# Patient Record
Sex: Female | Born: 1975
Health system: Southern US, Community
[De-identification: ages and names within clinical notes are randomized; demographics above are authoritative.]

## PROBLEM LIST (undated history)

## (undated) ENCOUNTER — Emergency Department: Discharge status: Absent without leave | Payer: Self-pay

## (undated) DIAGNOSIS — J45909 Unspecified asthma, uncomplicated: Secondary | ICD-10-CM

## (undated) DIAGNOSIS — I10 Essential (primary) hypertension: Principal | ICD-10-CM

## (undated) DIAGNOSIS — G4700 Insomnia, unspecified: Secondary | ICD-10-CM

## (undated) DIAGNOSIS — E669 Obesity, unspecified: Secondary | ICD-10-CM

## (undated) DIAGNOSIS — F419 Anxiety disorder, unspecified: Secondary | ICD-10-CM

## (undated) HISTORY — PX: CHOLECYSTECTOMY: SHX55

## (undated) HISTORY — DX: Obesity, unspecified: E66.9

## (undated) HISTORY — DX: Essential (primary) hypertension: I10

## (undated) HISTORY — DX: Unspecified asthma, uncomplicated: J45.909

---

## 1999-10-12 HISTORY — PX: BREAST REDUCTION SURGERY: SHX8

## 1999-10-12 HISTORY — PX: OTHER SURGICAL HISTORY: SHX169

## 2008-10-11 HISTORY — PX: TUBAL LIGATION: SHX77

## 2010-10-11 HISTORY — PX: OTHER SURGICAL HISTORY: SHX169

## 2014-01-02 ENCOUNTER — Encounter: Payer: Self-pay | Admitting: *Deleted

## 2014-01-02 ENCOUNTER — Encounter: Payer: Self-pay | Admitting: Family Medicine

## 2014-01-02 ENCOUNTER — Ambulatory Visit (INDEPENDENT_AMBULATORY_CARE_PROVIDER_SITE_OTHER): Payer: 59 | Admitting: Family Medicine

## 2014-01-02 VITALS — BP 139/93 | HR 82 | Ht 62.0 in | Wt 187.0 lb

## 2014-01-02 DIAGNOSIS — I10 Essential (primary) hypertension: Secondary | ICD-10-CM

## 2014-01-02 DIAGNOSIS — G4700 Insomnia, unspecified: Secondary | ICD-10-CM

## 2014-01-02 DIAGNOSIS — J45909 Unspecified asthma, uncomplicated: Secondary | ICD-10-CM | POA: Insufficient documentation

## 2014-01-02 DIAGNOSIS — E669 Obesity, unspecified: Secondary | ICD-10-CM | POA: Insufficient documentation

## 2014-01-02 DIAGNOSIS — E785 Hyperlipidemia, unspecified: Secondary | ICD-10-CM

## 2014-01-02 HISTORY — DX: Essential (primary) hypertension: I10

## 2014-01-02 HISTORY — DX: Unspecified asthma, uncomplicated: J45.909

## 2014-01-02 HISTORY — DX: Obesity, unspecified: E66.9

## 2014-01-02 MED ORDER — MONTELUKAST SODIUM 10 MG PO TABS: 10.0000 mg | ORAL_TABLET | Freq: Every day | ORAL | Status: DC

## 2014-01-02 MED ORDER — ZOLPIDEM TARTRATE 5 MG PO TABS: 5.0000 mg | ORAL_TABLET | Freq: Every evening | ORAL | Status: DC | PRN

## 2014-01-02 NOTE — Progress Notes (Signed)
CC: [name] [name] is a 38 y.o. female is here for Establish Care   Subjective: HPI:  Very pleasant 38 year old here to establish care  Patient complains of cough has been present for the past month that has been getting worse a weekly basis. Described as dry worse first thing in the morning and in the evenings. Improves with using albuterol that her son has. She has remote diagnosis of asthma in her young adult life. Symptoms are mild-to-moderate in severity. She admits to nocturnal coughing occasionally waking her up.    Reports a history essential hypertension she has been prescribed atenolol in the past however takes this only sporadically. No outside blood pressures to report. Denies any side effects from atenolol. Has not taken this week.  Reports a history of hyperlipidemia she's unsure when her last cholesterol panel was taken she has never been on cholesterol medication.  Her largest complaint today is insomnia that has been present for matter of months described as difficulty staying asleep and getting asleep. Nothing particularly makes it better or worse. She's tried melatonin and NyQuil both have lost their effectiveness. She denies anxiety, pain, nor mental disturbance. She goes to sleep at the same time every night same location without napping during the daytime.  She wants to know if she should get the LAP-BAND, a former provider told her that she does not weigh enough for it.   Review of Systems - General ROS: negative for - chills, fever, night sweats, weight gain or weight loss Ophthalmic ROS: negative for - decreased vision Psychological ROS: negative for - anxiety or depression ENT ROS: negative for - hearing change, nasal congestion, tinnitus or allergies Hematological and Lymphatic ROS: negative for - bleeding problems, bruising or swollen lymph nodes Breast ROS: negative Respiratory ROS: no shortness of breath, or wheezing Cardiovascular ROS: no chest pain or  dyspnea on exertion Gastrointestinal ROS: no abdominal pain, change in bowel habits, or black or bloody stools Genito-Urinary ROS: negative for - genital discharge, genital ulcers, incontinence or abnormal bleeding from genitals Musculoskeletal ROS: negative for - joint pain or muscle pain Neurological ROS: negative for - headaches or memory loss Dermatological ROS: negative for lumps, mole changes, rash and skin lesion changes  Past Medical History  Diagnosis Date  . Obesity 01/02/2014  . Asthma, chronic 01/02/2014  . Essential hypertension, benign 01/02/2014    History reviewed. No pertinent past surgical history. No family history on file.  History   Social History  . Marital Status: Married    Spouse Name: N/A    Number of Children: N/A  . Years of Education: N/A   Occupational History  . Not on file.   Social History Main Topics  . Smoking status: Never Smoker   . Smokeless tobacco: Not on file  . Alcohol Use: No  . Drug Use: Not on file  . Sexual Activity: Yes    Partners: Male   Other Topics Concern  . Not on file   Social History Narrative  . No narrative on file     Objective: BP 139/93  Pulse 82  Ht 5\' 2"  (1.575 m)  Wt 187 lb (84.823 kg)  BMI 34.19 kg/m2  General: Alert and Oriented, No Acute Distress HEENT: Pupils equal, round, reactive to light. Conjunctivae clear.  External ears unremarkable, canals clear with intact TMs with appropriate landmarks.  Middle ear appears open without effusion. Pink inferior turbinates.  Moist mucous membranes, pharynx without inflammation nor lesions however moderate cobblestoning.  Neck supple  without palpable lymphadenopathy nor abnormal masses. Lungs: Clear to auscultation bilaterally, no wheezing/ronchi/rales.  Comfortable work of breathing. Good air movement. Cardiac: Regular rate and rhythm. Normal S1/S2.  No murmurs, rubs, nor gallops.   Abdomen: Obese and soft Extremities: No peripheral edema.  Strong peripheral  pulses.  Mental Status: No depression, anxiety, nor agitation. Skin: Warm and dry.  Assessment & Plan: Chey was seen today for establish care.  Diagnoses and associated orders for this visit:  Essential hypertension, benign - BASIC METABOLIC PANEL WITH GFR  Hyperlipidemia - Lipid panel  Insomnia - zolpidem (AMBIEN) 5 MG tablet; Take 1 tablet (5 mg total) by mouth at bedtime as needed for sleep.  Asthma, chronic - montelukast (SINGULAIR) 10 MG tablet; Take 1 tablet (10 mg total) by mouth at bedtime.  Obesity    Essential hypertension: Uncontrolled we discussed diet and exercise interventions especially sodium restriction. Hold off on taking atenolol and focus only on diet and exercise interventions along with better sleep habits. If still uncontrolled in one month Will restart daily atenolol. Hyperlipidemia: Due for lipid panel Insomnia: Uncontrolled chronic condition start low-dose Ambien Asthma: Uncontrolled start Singulair  obesity: We will refer her to Dr. Romeo [facility] bariatric clinic for further questions regarding weight management  Return in about 4 weeks (around 01/30/2014).

## 2014-01-03 ENCOUNTER — Telehealth: Payer: Self-pay | Admitting: Family Medicine

## 2014-01-03 DIAGNOSIS — E669 Obesity, unspecified: Secondary | ICD-10-CM

## 2014-01-03 NOTE — Telephone Encounter (Signed)
Dr. Valetta Close Referral

## 2014-01-08 ENCOUNTER — Telehealth: Payer: Self-pay | Admitting: *Deleted

## 2014-01-08 DIAGNOSIS — J45909 Unspecified asthma, uncomplicated: Secondary | ICD-10-CM

## 2014-01-08 NOTE — Telephone Encounter (Signed)
Pt is calling requesting something for her cough.she was seen on the 25th and she states she still has the cough

## 2014-01-09 MED ORDER — PREDNISONE 20 MG PO TABS: ORAL_TABLET | ORAL | Status: DC

## 2014-01-09 NOTE — Telephone Encounter (Signed)
Prednisone taper sent to [facility].

## 2014-01-09 NOTE — Telephone Encounter (Signed)
Pt notified via vm.(home #, cell not in service)

## 2014-01-14 ENCOUNTER — Telehealth: Payer: Self-pay | Admitting: *Deleted

## 2014-01-14 DIAGNOSIS — E669 Obesity, unspecified: Secondary | ICD-10-CM

## 2014-01-14 NOTE — Telephone Encounter (Signed)
Referral has been placed. 

## 2014-01-14 NOTE — Telephone Encounter (Signed)
Pt wants to see bariatric physician within [place]. She doesn't want to go to a [facility]

## 2014-01-14 NOTE — Telephone Encounter (Signed)
Called and it said she was unavail

## 2014-01-18 ENCOUNTER — Ambulatory Visit (INDEPENDENT_AMBULATORY_CARE_PROVIDER_SITE_OTHER): Payer: 59 | Admitting: Family Medicine

## 2014-01-18 ENCOUNTER — Encounter: Payer: Self-pay | Admitting: Family Medicine

## 2014-01-18 VITALS — BP 155/102 | HR 84 | Wt 178.0 lb

## 2014-01-18 DIAGNOSIS — I10 Essential (primary) hypertension: Secondary | ICD-10-CM

## 2014-01-18 DIAGNOSIS — R002 Palpitations: Secondary | ICD-10-CM

## 2014-01-18 LAB — BASIC METABOLIC PANEL WITH GFR
BUN: 15 mg/dL (ref 6–23)
CO2: 29 mEq/L (ref 19–32)
Calcium: 10.4 mg/dL (ref 8.4–10.5)
Chloride: 97 mEq/L (ref 96–112)
Creat: 0.99 mg/dL (ref 0.50–1.10)
GFR, EST NON AFRICAN AMERICAN: 73 mL/min
GFR, Est African American: 84 mL/min
Glucose, Bld: 98 mg/dL (ref 70–99)
POTASSIUM: 4.2 meq/L (ref 3.5–5.3)
Sodium: 139 mEq/L (ref 135–145)

## 2014-01-18 LAB — CBC
HEMATOCRIT: 42.8 % (ref 36.0–46.0)
Hemoglobin: 13.9 g/dL (ref 12.0–15.0)
MCH: 27.1 pg (ref 26.0–34.0)
MCHC: 32.5 g/dL (ref 30.0–36.0)
MCV: 83.4 fL (ref 78.0–100.0)
Platelets: 320 10*3/uL (ref 150–400)
RBC: 5.13 MIL/uL — ABNORMAL HIGH (ref 3.87–5.11)
RDW: 13.8 % (ref 11.5–15.5)
WBC: 16.3 10*3/uL — ABNORMAL HIGH (ref 4.0–10.5)

## 2014-01-18 LAB — LIPID PANEL
CHOLESTEROL: 151 mg/dL (ref 0–200)
HDL: 51 mg/dL (ref >39)
LDL CALC: 86 mg/dL (ref 0–99)
Total CHOL/HDL Ratio: 3 Ratio
Triglycerides: 71 mg/dL (ref <150)
VLDL: 14 mg/dL (ref 0–40)

## 2014-01-18 LAB — COMPLETE METABOLIC PANEL WITH GFR
ALBUMIN: 4.3 g/dL (ref 3.5–5.2)
ALT: 45 U/L — AB (ref 0–35)
AST: 17 U/L (ref 0–37)
Alkaline Phosphatase: 51 U/L (ref 39–117)
BUN: 15 mg/dL (ref 6–23)
CHLORIDE: 97 meq/L (ref 96–112)
CO2: 29 mEq/L (ref 19–32)
Calcium: 10.5 mg/dL (ref 8.4–10.5)
Creat: 0.98 mg/dL (ref 0.50–1.10)
GFR, Est African American: 85 mL/min
GFR, Est Non African American: 73 mL/min
Glucose, Bld: 100 mg/dL — ABNORMAL HIGH (ref 70–99)
Potassium: 3.9 mEq/L (ref 3.5–5.3)
SODIUM: 138 meq/L (ref 135–145)
TOTAL PROTEIN: 7.8 g/dL (ref 6.0–8.3)
Total Bilirubin: 0.8 mg/dL (ref 0.2–1.2)

## 2014-01-18 MED ORDER — METOPROLOL SUCCINATE ER 50 MG PO TB24: 50.0000 mg | ORAL_TABLET | Freq: Every day | ORAL | Status: DC

## 2014-01-18 NOTE — Progress Notes (Signed)
CC: [name] [name] is a 38 y.o. female is here for Palpitations   Subjective: HPI:  Reports palpitations for the past week described as a skipped beat that occurs occasionally throughout the day. Nothing particularly makes it better or worse. Inventions have included trying atenolol without any improvement or decline of symptoms. Since that occur anytime of the day they do not interfere with sleep. And they're not exacerbated by exercise or exertion. Symptoms overall are moderate in severity Denies motor or sensory disturbances, exertional chest pain, shortness of breath, chest pain at rest, diaphoresis, limb claudication, nor jaw pain. No outside blood pressures to report.    Review Of Systems Outlined In HPI  Past Medical History  Diagnosis Date  . Obesity 01/02/2014  . Asthma, chronic 01/02/2014  . Essential hypertension, benign 01/02/2014    Past Surgical History  Procedure Laterality Date  . Gallbladder removed  2001  . Breast reduction surgery  2001  . Lipo suction  2012  . Tubal ligation  2010   No family history on file.  History   Social History  . Marital Status: Married    Spouse Name: N/A    Number of Children: N/A  . Years of Education: N/A   Occupational History  . Not on file.   Social History Main Topics  . Smoking status: Never Smoker   . Smokeless tobacco: Not on file  . Alcohol Use: No  . Drug Use: Not on file  . Sexual Activity: Yes    Partners: Male   Other Topics Concern  . Not on file   Social History Narrative  . No narrative on file     Objective: BP 155/102  Pulse 84  Wt 178 lb (80.74 kg)  General: Alert and Oriented, No Acute Distress HEENT: Pupils equal, round, reactive to light. Conjunctivae clear.  Moist mucous membranes pharynx unremarkable Lungs: Clear to auscultation bilaterally, no wheezing/ronchi/rales.  Comfortable work of breathing. Good air movement. Cardiac: Regular rate and rhythm. Normal S1/S2.  No murmurs, rubs, nor  gallops.  No carotid bruit Abdomen: Normal bowel sounds, soft and non tender without palpable masses. Extremities: No peripheral edema.  Strong peripheral pulses.  Mental Status: No depression, anxiety, nor agitation. Skin: Warm and dry.  Assessment & Plan: Cher was seen today for palpitations.  Diagnoses and associated orders for this visit:  Palpitations - TSH - COMPLETE METABOLIC PANEL WITH GFR - CBC - PR ELECTROCARDIOGRAM, COMPLETE  Essential hypertension, benign - metoprolol succinate (TOPROL-XL) 50 MG 24 hr tablet; Take 1 tablet (50 mg total) by mouth daily. Take with or immediately following a meal.    Palpitations: EKG was obtained showing normal sinus rhythm, normal axis, normal intervals, Q waves in lead 3, no ST elevation or depression.  Start beta blocker below rule out hyperthyroidism, electrolyte abnormality or anemia Essential hypertension: Uncontrolled start metoprolol    Return in about 4 weeks (around 02/15/2014).

## 2014-01-19 LAB — TSH: TSH: 1.174 u[IU]/mL (ref 0.350–4.500)

## 2014-01-21 ENCOUNTER — Encounter: Payer: Self-pay | Admitting: *Deleted

## 2014-01-21 ENCOUNTER — Encounter: Payer: Self-pay | Admitting: Family Medicine

## 2014-01-21 DIAGNOSIS — D72829 Elevated white blood cell count, unspecified: Secondary | ICD-10-CM | POA: Insufficient documentation

## 2014-01-28 ENCOUNTER — Ambulatory Visit (INDEPENDENT_AMBULATORY_CARE_PROVIDER_SITE_OTHER): Payer: 59 | Admitting: Family Medicine

## 2014-01-28 ENCOUNTER — Encounter: Payer: Self-pay | Admitting: Family Medicine

## 2014-01-28 VITALS — BP 134/87 | HR 84 | Wt 184.0 lb

## 2014-01-28 DIAGNOSIS — D72829 Elevated white blood cell count, unspecified: Secondary | ICD-10-CM

## 2014-01-28 DIAGNOSIS — I10 Essential (primary) hypertension: Secondary | ICD-10-CM

## 2014-01-28 MED ORDER — METOPROLOL SUCCINATE ER 50 MG PO TB24: 50.0000 mg | ORAL_TABLET | Freq: Every day | ORAL | Status: DC

## 2014-01-28 NOTE — Progress Notes (Signed)
CC: [name] [name] is a 38 y.o. female is here for f/u heart palpitations and blood work   Subjective: HPI:  Followup essential hypertension: Since starting metoprolol she has had resolution of her palpitations. no outside blood pressures to report. Denies chest pain, shortness of breath, orthopnea nor peripheral edema. Denies motor or sensory disturbances nor lightheadedness.  Followup leukocytosis: From what she can recall when she gave her blood sample earlier this month she was in her regular state of health without any symptoms of allergies nor any sense of feeling unwell.  At that time she specifically denies any cough, shortness of breath, wheezing, abdominal pain, skin complaints, nor genitourinary complaints. Day she says that she has mild nasal congestion and a mild nonproductive cough it has slowly been improving over the past 3 days. Denies fevers. Has never been told that her white blood count is elevated. Denies any bleeding abnormalities, swollen lymph nodes, nor trouble fighting infections.   Review Of Systems Outlined In HPI  Past Medical History  Diagnosis Date  . Obesity 01/02/2014  . Asthma, chronic 01/02/2014  . Essential hypertension, benign 01/02/2014    Past Surgical History  Procedure Laterality Date  . Gallbladder removed  2001  . Breast reduction surgery  2001  . Lipo suction  2012  . Tubal ligation  2010   No family history on file.  History   Social History  . Marital Status: Married    Spouse Name: N/A    Number of Children: N/A  . Years of Education: N/A   Occupational History  . Not on file.   Social History Main Topics  . Smoking status: Never Smoker   . Smokeless tobacco: Not on file  . Alcohol Use: No  . Drug Use: Not on file  . Sexual Activity: Yes    Partners: Male   Other Topics Concern  . Not on file   Social History Narrative  . No narrative on file     Objective: BP 134/87  Pulse 84  Wt 184 lb (83.462 kg)  General:  Alert and Oriented, No Acute Distress HEENT: Pupils equal, round, reactive to light. Conjunctivae clear.  External ears unremarkable, canals clear with intact TMs with appropriate landmarks.  Middle ear appears open without effusion. Pink inferior turbinates.  Moist mucous membranes, pharynx without inflammation nor lesions.  Neck supple without palpable lymphadenopathy nor abnormal masses. Lungs: Clear to auscultation bilaterally, no wheezing/ronchi/rales.  Comfortable work of breathing. Good air movement. Cardiac: Regular rate and rhythm. Normal S1/S2.  No murmurs, rubs, nor gallops.   Extremities: No peripheral edema.  Strong peripheral pulses.  Mental Status: No depression, anxiety, nor agitation. Skin: Warm and dry.  Assessment & Plan: [name] [name] was seen today for f/u heart palpitations and blood work.  Diagnoses and associated orders for this visit:  Essential hypertension, benign - metoprolol succinate (TOPROL-XL) 50 MG 24 hr tablet; Take 1 tablet (50 mg total) by mouth daily. Take with or immediately following a meal.  Leukocytosis - CBC with Differential    Essential hypertension: Controlled continue metoprolol Leukocytosis: I've asked her to come back at her convenience in the day that she feels that she is back in her regular state of health, currently she is getting over a viral upper respiratory illness which would potentially influence the recheck of her white blood count with differential.  Return if symptoms worsen or fail to improve.

## 2014-01-30 ENCOUNTER — Encounter: Payer: Self-pay | Admitting: Dietician

## 2014-01-30 ENCOUNTER — Encounter: Payer: 59 | Attending: Family Medicine | Admitting: Dietician

## 2014-01-30 VITALS — Ht 62.0 in | Wt 182.4 lb

## 2014-01-30 DIAGNOSIS — E669 Obesity, unspecified: Secondary | ICD-10-CM | POA: Insufficient documentation

## 2014-01-30 DIAGNOSIS — I10 Essential (primary) hypertension: Secondary | ICD-10-CM | POA: Insufficient documentation

## 2014-01-30 DIAGNOSIS — J45909 Unspecified asthma, uncomplicated: Secondary | ICD-10-CM | POA: Insufficient documentation

## 2014-01-30 DIAGNOSIS — Z6833 Body mass index (BMI) 33.0-33.9, adult: Secondary | ICD-10-CM | POA: Insufficient documentation

## 2014-01-30 DIAGNOSIS — Z713 Dietary counseling and surveillance: Secondary | ICD-10-CM | POA: Insufficient documentation

## 2014-01-30 NOTE — Progress Notes (Signed)
  Medical Nutrition Therapy:  Appt start time: 0915 end time:  0945   Assessment:  Primary concerns today: [name] [name] is here today interested in pursuing lap band surgery. She reports frustration from past failed dieting attempts. She has tried [employer] Watchers, gym membership, medically supervised weight loss, Alli pills and other diet pills, Atkins diet, working with a dietitian, low carb diet, and calorie counting. She has hypertension and fears developing diabetes. She reports difficulty sleeping at night and has asthma. [name] [name] has family members (husband, mother, and mother-in-law) that support her decision to pursue surgery. She denies emotional eating and binge eating and has never used laxatives or vomiting to try to lose weight. She states that her weight was normal as a child. She began having difficulty maintaining a healthy weight after having her own children. [name] [name] reports weight fluctuations throughout her life; her highest weight was 197 lbs. [name] [name] does not drink alcohol or smoke cigarettes.  Preferred Learning Style:   No preference indicated   Learning Readiness:   Contemplating  Ready  MEDICATIONS: see list   DIETARY INTAKE:  24-hr recall:  B ( AM): raisin bran cereal  Snk ( AM):   L ( PM): fast food or [place] and cheese sandwich Snk ( PM): unsalted pretzels, trail mix bars D ( PM): baked chicken, brown rice, vegetables Snk ( PM):  Beverages: water, regular soda, sweet tea, coffee  Usual physical activity: recently started going to the gym  Estimated energy needs: 1800 calories  Progress Towards Goal(s):  No progress.   Nutritional Diagnosis:  [place]-3.3 Overweight/obesity As related to patient interested in weight loss surgery.  As evidenced by BMI 33.    Intervention:  Nutrition education provided. Described the 3 different surgeries performed at [facility]. Explained the first steps in pursuing surgery. Provided "pre-op goals" handout and  encouraged patient to begin practicing. Discussed pre op diet and post op diet progression.   Teaching Method Utilized:  Visual Auditory  Handouts given during visit include:  Pre-op goals  Protein shakes  Barriers to learning/adherence to lifestyle change: none  Demonstrated degree of understanding via:  Teach Back   Monitoring/Evaluation:  Dietary intake, exercise, and body weight prn.

## 2014-02-05 LAB — CBC WITH DIFFERENTIAL/PLATELET
BASOS ABS: 0 10*3/uL (ref 0.0–0.1)
Basophils Relative: 0 % (ref 0–1)
EOS PCT: 3 % (ref 0–5)
Eosinophils Absolute: 0.2 10*3/uL (ref 0.0–0.7)
HEMATOCRIT: 39 % (ref 36.0–46.0)
HEMOGLOBIN: 12.7 g/dL (ref 12.0–15.0)
LYMPHS ABS: 1.7 10*3/uL (ref 0.7–4.0)
LYMPHS PCT: 32 % (ref 12–46)
MCH: 27.9 pg (ref 26.0–34.0)
MCHC: 32.6 g/dL (ref 30.0–36.0)
MCV: 85.7 fL (ref 78.0–100.0)
MONOS PCT: 6 % (ref 3–12)
Monocytes Absolute: 0.3 10*3/uL (ref 0.1–1.0)
Neutro Abs: 3.1 10*3/uL (ref 1.7–7.7)
Neutrophils Relative %: 59 % (ref 43–77)
Platelets: 256 10*3/uL (ref 150–400)
RBC: 4.55 MIL/uL (ref 3.87–5.11)
RDW: 13.6 % (ref 11.5–15.5)
WBC: 5.3 10*3/uL (ref 4.0–10.5)

## 2014-02-07 ENCOUNTER — Ambulatory Visit: Payer: 59 | Admitting: Family Medicine

## 2014-03-13 ENCOUNTER — Ambulatory Visit: Payer: 59 | Admitting: Dietician

## 2014-03-15 ENCOUNTER — Telehealth: Payer: Self-pay | Admitting: Family Medicine

## 2014-03-15 DIAGNOSIS — G4700 Insomnia, unspecified: Secondary | ICD-10-CM

## 2014-03-15 MED ORDER — ZOLPIDEM TARTRATE 5 MG PO TABS: 5.0000 mg | ORAL_TABLET | Freq: Every evening | ORAL | Status: DC | PRN

## 2014-03-15 NOTE — Telephone Encounter (Signed)
rx faxed

## 2014-03-15 NOTE — Telephone Encounter (Signed)
[name] [name], Rx placed in in-box ready for pickup/faxing.  

## 2014-03-25 ENCOUNTER — Encounter: Payer: Self-pay | Admitting: Family Medicine

## 2014-03-25 ENCOUNTER — Ambulatory Visit (INDEPENDENT_AMBULATORY_CARE_PROVIDER_SITE_OTHER): Payer: 59 | Admitting: Family Medicine

## 2014-03-25 VITALS — BP 128/84 | HR 73 | Temp 98.0°F | Wt 184.0 lb

## 2014-03-25 DIAGNOSIS — G4700 Insomnia, unspecified: Secondary | ICD-10-CM

## 2014-03-25 DIAGNOSIS — H6980 Other specified disorders of Eustachian tube, unspecified ear: Secondary | ICD-10-CM

## 2014-03-25 DIAGNOSIS — H6981 Other specified disorders of Eustachian tube, right ear: Secondary | ICD-10-CM

## 2014-03-25 MED ORDER — ZOLPIDEM TARTRATE 10 MG PO TABS: 10.0000 mg | ORAL_TABLET | Freq: Every evening | ORAL | Status: DC | PRN

## 2014-03-25 MED ORDER — PREDNISONE 20 MG PO TABS: ORAL_TABLET | ORAL | Status: AC

## 2014-03-25 MED ORDER — OXYMETAZOLINE HCL 0.05 % NA SOLN: 1.0000 | Freq: Two times a day (BID) | NASAL | Status: DC

## 2014-03-25 NOTE — Progress Notes (Signed)
CC: [name] [name] is a 38 y.o. female is here for right ear feels clogged   Subjective: HPI:  Complains of right ear fullness that has been present for the past week came on gradually over the course of 2 days and has been persistent ever since. Present all hours of the day moderate in severity. Nothing makes better or worse. Has tried to congestion sinus medication without any benefit. Reports mild hearing loss described as hearing sounds like she is under water. Denies dizziness, tinnitus, motor or sensory disturbances other than that described above.  Denies fevers, chills, nasal congestion, sneezing, sore throat, nor postnasal drip  Complains that Ambien is slowly becoming more ineffective for her insomnia. Difficulty both falling asleep and staying asleep. Nothing particularly seems to keep her awake. She denies pain, anxiety, nervousness, nor mental disturbance. Insomnia is described as moderate in severity on a daily basis   Review Of Systems Outlined In HPI  Past Medical History  Diagnosis Date  . Obesity 01/02/2014  . Asthma, chronic 01/02/2014  . Essential hypertension, benign 01/02/2014    Past Surgical History  Procedure Laterality Date  . Gallbladder removed  2001  . Breast reduction surgery  2001  . Lipo suction  2012  . Tubal ligation  2010   Family History  Problem Relation Age of Onset  . Hypertension Other     History   Social History  . Marital Status: Married    Spouse Name: N/A    Number of Children: N/A  . Years of Education: N/A   Occupational History  . Not on file.   Social History Main Topics  . Smoking status: Never Smoker   . Smokeless tobacco: Not on file  . Alcohol Use: No  . Drug Use: Not on file  . Sexual Activity: Yes    Partners: Male   Other Topics Concern  . Not on file   Social History Narrative  . No narrative on file     Objective: BP 128/84  Pulse 73  Temp(Src) 98 F (36.7 C) (Oral)  Wt 184 lb (83.462  kg)  General: Alert and Oriented, No Acute Distress HEENT: Pupils equal, round, reactive to light. Conjunctivae clear.  External ears unremarkable, canals clear with intact TMs with appropriate landmarks. Left Middle ear appears open without effusion right middle ear with mild serous effusion. Pink inferior turbinates.  Moist mucous membranes, pharynx without inflammation nor lesions.  Neck supple without palpable lymphadenopathy nor abnormal masses. Lungs: Clear to auscultation bilaterally, no wheezing/ronchi/rales.  Comfortable work of breathing. Good air movement. Extremities: No peripheral edema.  Strong peripheral pulses.  Mental Status: No depression, anxiety, nor agitation. Skin: Warm and dry.  Assessment & Plan: [name] [name] was seen today for right ear feels clogged.  Diagnoses and associated orders for this visit:  Dysfunction of right Eustachian tube - predniSONE (DELTASONE) 20 MG tablet; Three tabs at once daily for five days. - oxymetazoline (AFRIN) 0.05 % nasal spray; Place 1 spray into both nostrils 2 (two) times daily.  Insomnia - zolpidem (AMBIEN) 10 MG tablet; Take 1 tablet (10 mg total) by mouth at bedtime as needed for sleep.    Eustachian tube dysfunction: Start prednisone burst and Afrin the latter of the 2 to only be used for 3 days. Cough no better by Friday for referral to ENT Insomnia: Uncontrolled chronic condition increasing Ambien  Return if symptoms worsen or fail to improve.

## 2014-03-29 ENCOUNTER — Telehealth: Payer: Self-pay | Admitting: *Deleted

## 2014-03-29 DIAGNOSIS — H6993 Unspecified Eustachian tube disorder, bilateral: Secondary | ICD-10-CM

## 2014-03-29 DIAGNOSIS — H6983 Other specified disorders of Eustachian tube, bilateral: Secondary | ICD-10-CM

## 2014-03-29 NOTE — Telephone Encounter (Signed)
Referral has been placed. 

## 2014-03-29 NOTE — Telephone Encounter (Signed)
Pt left a message that her ears "dont feel 100%" and she would like a referral to ENT

## 2014-04-01 NOTE — Telephone Encounter (Signed)
Pt.notified

## 2014-04-23 ENCOUNTER — Other Ambulatory Visit: Payer: Self-pay | Admitting: Family Medicine

## 2014-05-02 ENCOUNTER — Ambulatory Visit (INDEPENDENT_AMBULATORY_CARE_PROVIDER_SITE_OTHER): Payer: 59 | Admitting: Family Medicine

## 2014-05-02 ENCOUNTER — Encounter: Payer: Self-pay | Admitting: Family Medicine

## 2014-05-02 VITALS — BP 145/94 | HR 73 | Wt 187.0 lb

## 2014-05-02 DIAGNOSIS — F411 Generalized anxiety disorder: Secondary | ICD-10-CM | POA: Insufficient documentation

## 2014-05-02 MED ORDER — ESCITALOPRAM OXALATE 5 MG PO TABS: 5.0000 mg | ORAL_TABLET | Freq: Every day | ORAL | Status: DC

## 2014-05-02 NOTE — Progress Notes (Signed)
CC: [name] [name] is a 38 y.o. female is here for left leg pain   Subjective: HPI:   Patient complains of a "funny " feeling in her left upper extremity and left lower extremity that has been present on a daily basis for the past week. Is mild to moderate in severity present both with activity and at rest. As further described as a pins and needle sensation or a mild numbness. It involves the entire arm and hand and leg and foot. Nothing particularly makes better or worse, interventions have included aspirin.  She's had similar symptoms in the past but they've only lasted a few days and went away without intervention. She denies neck pain nor any back pain. She denies any recent or remote trauma. Denies weakness, nor muscle atrophy in the regions affected.  She denies any other motor or sensory disturbances and denies short-term or long-term memory loss.  No new medications or supplements. She admits that she's been experiencing much more anxiety recently over the past few months which has been amplified over the unexpected death of her sister last week. She reports trouble falling asleep, nervousness, and excessive worrying that is mild to moderate in severity. Denies fevers, chills, chest pain, shortness of breath, GI disturbance, nor skin changes denies limb claudication.   Review Of Systems Outlined In HPI  Past Medical History  Diagnosis Date  . Obesity 01/02/2014  . Asthma, chronic 01/02/2014  . Essential hypertension, benign 01/02/2014    Past Surgical History  Procedure Laterality Date  . Gallbladder removed  2001  . Breast reduction surgery  2001  . Lipo suction  2012  . Tubal ligation  2010   Family History  Problem Relation Age of Onset  . Hypertension Other     History   Social History  . Marital Status: Married    Spouse Name: N/A    Number of Children: N/A  . Years of Education: N/A   Occupational History  . Not on file.   Social History Main Topics  . Smoking  status: Never Smoker   . Smokeless tobacco: Not on file  . Alcohol Use: No  . Drug Use: Not on file  . Sexual Activity: Yes    Partners: Male   Other Topics Concern  . Not on file   Social History Narrative  . No narrative on file     Objective: BP 145/94  Pulse 73  Wt 187 lb (84.823 kg)  General: Alert and Oriented, No Acute Distress HEENT: Pupils equal, round, reactive to light. Conjunctivae clear.   moist membranes pharynx unremarkable  Lungs: Clear to auscultation bilaterally, no wheezing/ronchi/rales.  Comfortable work of breathing. Good air movement. Cardiac: Regular rate and rhythm. Normal S1/S2.  No murmurs, rubs, nor gallops.   Neuro: CN II-XII grossly intact, full strength/rom of all four extremities, C5/L4/S1 DTRs 2/4 bilaterally, gait normal, rapid alternating movements normal, heel-shin test normal, Rhomberg normal. Light touch sensation intact in all dermatomes involving the left upper extremity and right lower extremity  Extremities: No peripheral edema.  Strong peripheral pulses.  Mental Status: No depression, anxiety, nor agitation. Skin: Warm and dry.  Assessment & Plan: [name] [name] was seen today for left leg pain.  Diagnoses and associated orders for this visit:  Generalized anxiety disorder - escitalopram (LEXAPRO) 5 MG tablet; Take 1 tablet (5 mg total) by mouth daily.    Reassurance provided there is no red flags for neuroimaging or nerve conduction study at this time, start low-dose Lexapro  for better control of anxiety and if symptoms persist will refer to neurology.  25 minutes spent face-to-face during visit today of which at least 50% was counseling or coordinating care regarding: 1. Generalized anxiety disorder       Return in about 3 months (around 08/02/2014).

## 2014-05-08 ENCOUNTER — Encounter: Payer: Self-pay | Admitting: Family Medicine

## 2014-05-08 DIAGNOSIS — R202 Paresthesia of skin: Secondary | ICD-10-CM

## 2014-06-05 ENCOUNTER — Other Ambulatory Visit: Payer: Self-pay | Admitting: *Deleted

## 2014-06-05 DIAGNOSIS — G4700 Insomnia, unspecified: Secondary | ICD-10-CM

## 2014-06-05 MED ORDER — ZOLPIDEM TARTRATE 10 MG PO TABS: 10.0000 mg | ORAL_TABLET | Freq: Every evening | ORAL | Status: DC | PRN

## 2014-06-07 ENCOUNTER — Telehealth: Payer: Self-pay | Admitting: Family Medicine

## 2014-06-07 DIAGNOSIS — G4700 Insomnia, unspecified: Secondary | ICD-10-CM

## 2014-06-07 MED ORDER — ZOLPIDEM TARTRATE 10 MG PO TABS: 10.0000 mg | ORAL_TABLET | Freq: Every evening | ORAL | Status: DC | PRN

## 2014-06-07 NOTE — Telephone Encounter (Signed)
rx faxed

## 2014-06-07 NOTE — Telephone Encounter (Signed)
[name] [name], Rx placed in in-box ready for pickup/faxing. Pt prefers [employer]

## 2014-06-24 ENCOUNTER — Ambulatory Visit (INDEPENDENT_AMBULATORY_CARE_PROVIDER_SITE_OTHER): Payer: 59 | Admitting: Neurology

## 2014-06-24 ENCOUNTER — Encounter: Payer: Self-pay | Admitting: Neurology

## 2014-06-24 VITALS — BP 120/90 | HR 77 | Ht 62.0 in | Wt 186.4 lb

## 2014-06-24 DIAGNOSIS — R29818 Other symptoms and signs involving the nervous system: Secondary | ICD-10-CM

## 2014-06-24 DIAGNOSIS — R209 Unspecified disturbances of skin sensation: Secondary | ICD-10-CM

## 2014-06-24 LAB — C-REACTIVE PROTEIN: CRP: 0.8 mg/dL — AB (ref <0.60)

## 2014-06-24 LAB — VITAMIN B12: VITAMIN B 12: 413 pg/mL (ref 211–911)

## 2014-06-24 LAB — CREATININE, SERUM: Creat: 0.87 mg/dL (ref 0.50–1.10)

## 2014-06-24 LAB — SEDIMENTATION RATE: Sed Rate: 4 mm/hr (ref 0–22)

## 2014-06-24 LAB — BUN: BUN: 8 mg/dL (ref 6–23)

## 2014-06-24 NOTE — Patient Instructions (Addendum)
1.  MRI brain wwo contrast 2.  Check vitamin B12, vitamin D, ESR, CRP 3.  EMG of the left arm and leg 4.  Recommend dilated eye exam with eye doctor 5.  Return to clinic in 71-month

## 2014-06-24 NOTE — Progress Notes (Signed)
[facility] Clinic Note - Initial Visit   Date: 06/24/2014  [name] [name] MRN: [id number] DOB: 17-Dec-1975   Dear Dr. Ileene Rubens:  Thank you for your kind referral of [name] [name] for consultation of left sided sensory changes. Although her history is well known to you, please allow [place] to reiterate it for the purpose of our medical record. The patient was accompanied to the clinic by self.    History of Present Illness: [name] [name] is a 38 y.o. right-handed African American female with history of hypertension, asthma, and anxiety presenting for evaluation of left arm and leg tingling.    Starting in July 2015, she developed intermittent spells of tingling/numbness left forearm and left lower leg.  It is worse at night and she tries to walk around which helps. Symptoms became constant over the past several weeks.  She has tried aspirin, but no significant relief with it.  She reports to dropping things from her hands.  No recent falls.    No preceding illness, infections, or life stressors.  She endorses blurry vision and neck pain.  No loss of vision, change of taste/ swallowing/talking, bowel/bladder incontinence, or weakness.  No similar symptoms on the right side.    She was recently diagnosed with anxiety because of constellation of symptoms including chest pain, insomnia, nervousness, and excessive worrying.  At her last clinic visit with her PCP in July 2015, she was started on Lexapro, but there has been no change in her sensory symptoms.   Out-side paper records, electronic medical record, and images have been reviewed where available and summarized as:  Lab Results  Component Value Date   WBC 5.3 02/04/2014   HGB 12.7 02/04/2014   HCT 39.0 02/04/2014   MCV 85.7 02/04/2014   PLT 256 02/04/2014   Lab Results  Component Value Date   TSH 1.174 01/18/2014     Past Medical History  Diagnosis Date  . Obesity 01/02/2014  . Asthma, chronic  01/02/2014  . Essential hypertension, benign 01/02/2014    Past Surgical History  Procedure Laterality Date  . Gallbladder removed  2001  . Breast reduction surgery  2001  . Lipo suction  2012  . Tubal ligation  2010     Medications:  Current Outpatient Prescriptions on File Prior to Visit  Medication Sig Dispense Refill  . escitalopram (LEXAPRO) 5 MG tablet Take 1 tablet (5 mg total) by mouth daily.  30 tablet  4  . metoprolol succinate (TOPROL-XL) 50 MG 24 hr tablet Take 1 tablet (50 mg total) by mouth daily. Take with or immediately following a meal.  30 tablet  3  . montelukast (SINGULAIR) 10 MG tablet TAKE ONE TABLET BY MOUTH AT BEDTIME  30 tablet  2  . oxymetazoline (AFRIN) 0.05 % nasal spray Place 1 spray into both nostrils 2 (two) times daily.  30 mL  0  . zolpidem (AMBIEN) 10 MG tablet Take 1 tablet (10 mg total) by mouth at bedtime as needed for sleep.  30 tablet  1   No current facility-administered medications on file prior to visit.    Allergies: No Known Allergies  Family History: Family History  Problem Relation Age of Onset  . Hypertension Other     Social History: History   Social History  . Marital Status: Married    Spouse Name: N/A    Number of Children: N/A  . Years of Education: N/A   Occupational History  . Not on file.  Social History Main Topics  . Smoking status: Never Smoker   . Smokeless tobacco: Not on file  . Alcohol Use: No  . Drug Use: Not on file  . Sexual Activity: Yes    Partners: Male   Other Topics Concern  . Not on file   Social History Narrative  . No narrative on file    Review of Systems:  CONSTITUTIONAL: No fevers, chills, night sweats, or weight loss.   EYES: No visual changes or eye pain ENT: No hearing changes.  No history of nose bleeds.   RESPIRATORY: No cough, wheezing and shortness of breath.   CARDIOVASCULAR: + chest pain, and palpitations.   GI: Negative for abdominal discomfort, blood in stools or  black stools.  No recent change in bowel habits.   GU:  No history of incontinence.   MUSCLOSKELETAL: No history of joint pain or swelling.  No myalgias.   SKIN: Negative for lesions, rash, and itching.   HEMATOLOGY/ONCOLOGY: Negative for prolonged bleeding, bruising easily, and swollen nodes.  No history of cancer.   ENDOCRINE: Negative for cold or heat intolerance, polydipsia or goiter.   PSYCH:  No depression + anxiety symptoms.   NEURO: As Above.   Vital Signs:  BP 120/90  Pulse 77  Ht 5' 2"  (1.575 m)  Wt 186 lb 7 oz (84.567 kg)  BMI 34.09 kg/m2  SpO2 98% Pain Scale: 0 on a scale of 0-10   General Medical Exam:   General:  Well appearing, comfortable.   Eyes/ENT: see cranial nerve examination.   Neck: No masses appreciated.  Full range of motion without tenderness.  No carotid bruits.  Negative Lhermitte's sign. Respiratory:  Clear to auscultation, good air entry bilaterally.   Cardiac:  Regular rate and rhythm, no murmur.   GI:  Soft, non-tender, non-distended abdomen.  Bowel sounds normal. No masses, organomegaly.   Back:  No pain to palpation of spinous processes.   Extremities:  No deformities, edema, or skin discoloration. Good capillary refill.   Skin:  Skin color, texture, turgor normal. No rashes or lesions.  Neurological Exam: MENTAL STATUS including orientation to time, place, person, recent and remote memory, attention span and concentration, language, and fund of knowledge is normal.  Speech is not dysarthric.  CRANIAL NERVES: II:  No visual field defects.  Unremarkable fundi.   III-IV-VI: Pupils equal round and reactive to light.  No APD.  Normal conjugate, extra-ocular eye movements in all directions of gaze.  No nystagmus.  No ptosis.   V:  Normal facial sensation.   VII:  Normal facial symmetry and movements.  No pathologic facial reflexes.  VIII:  Normal hearing and vestibular function.   IX-X:  Normal palatal movement.   XI:  Normal shoulder shrug and  head rotation.   XII:  Normal tongue strength and range of motion, no deviation or fasciculation.  MOTOR:  No atrophy, fasciculations or abnormal movements.  No pronator drift.  Tone is normal.    Right Upper Extremity:    Left Upper Extremity:    Deltoid  5/5   Deltoid  5/5   Biceps  5/5   Biceps  5/5   Triceps  5/5   Triceps  5/5   Wrist extensors  5/5   Wrist extensors  5/5   Wrist flexors  5/5   Wrist flexors  5/5   Finger extensors  5/5   Finger extensors  5/5   Finger flexors  5/5   Finger flexors  5/5   Dorsal interossei  5/5   Dorsal interossei  5/5   Abductor pollicis  5/5   Abductor pollicis  5/5   Tone (Ashworth scale)  0  Tone (Ashworth scale)  0   Right Lower Extremity:    Left Lower Extremity:    Hip flexors  5/5   Hip flexors  5/5   Hip extensors  5/5   Hip extensors  5/5   Knee flexors  5/5   Knee flexors  5/5   Knee extensors  5/5   Knee extensors  5/5   Dorsiflexors  5/5   Dorsiflexors  5/5   Plantarflexors  5/5   Plantarflexors  5/5   Toe extensors  5/5   Toe extensors  5/5   Toe flexors  5/5   Toe flexors  5/5   Tone (Ashworth scale)  0  Tone (Ashworth scale)  0   MSRs:  Right                                                                 Left brachioradialis 2+  brachioradialis 3+  biceps 2+  biceps 3+  triceps 2+  triceps 2+  patellar 2+  patellar 3+  ankle jerk 2+  ankle jerk 2+  Hoffman no  Hoffman no  plantar response down  plantar response down   SENSORY:  Normal and symmetric perception of light touch, pinprick, vibration, and proprioception.  Romberg's sign absent. There is no sensory level.  COORDINATION/GAIT: Normal finger-to- nose-finger and heel-to-shin.  Intact rapid alternating movements bilaterally.  Able to rise from a chair without using arms.  Gait narrow based and stable. Tandem and stressed gait intact.    IMPRESSION/PLAN: Ms. Kemmer is a 38 year-old female presenting for evaluation of left hemisensory paresthesias involving the  arm and leg.  Her exam is notable for slightly brisker reflexes on the left side, compared to the right.  Plantars are down going bilaterally.  Otherwise, cranial nerves, motor strength, and sensation is normal.  Because of her age and lateralizing findings, I will order MRI brain to look for structural disease, particularly demyelinating disease.  Laboratory testing will include ESR, CRP, vitamin B12, and vitamin D.   I explained that anxiety can manifest in various ways, but before I can attribute symptoms to this, a complete work-up is necessary.  Therefore,if above work-up is negative, may consider NCS/EMG of the left side.    Return to clinic in 14-month.   The duration of this appointment visit was 40 minutes of face-to-face time with the patient.  Greater than 50% of this time was spent in counseling, explanation of diagnosis, planning of further management, and coordination of care.   Thank you for allowing me to participate in patient's care.  If I can answer any additional questions, I would be pleased to do so.    Sincerely,    Lajarvis Italiano K. PPosey Pronto DO

## 2014-06-25 ENCOUNTER — Telehealth: Payer: Self-pay | Admitting: Neurology

## 2014-06-25 LAB — VITAMIN D 25 HYDROXY (VIT D DEFICIENCY, FRACTURES): VIT D 25 HYDROXY: 27 ng/mL — AB (ref 30–89)

## 2014-06-25 NOTE — Telephone Encounter (Signed)
Pt returning a call to [place] / Sherri S.

## 2014-06-25 NOTE — Telephone Encounter (Signed)
Attempted to call back.  Got voicemail again.

## 2014-07-01 ENCOUNTER — Other Ambulatory Visit: Payer: 59

## 2014-08-19 ENCOUNTER — Telehealth: Payer: Self-pay | Admitting: *Deleted

## 2014-08-19 NOTE — Telephone Encounter (Signed)
Patient cancelled her EMG scheduled for 11/16 will call to reschedule at a later date

## 2014-08-19 NOTE — Telephone Encounter (Signed)
Noted  

## 2014-08-26 ENCOUNTER — Encounter: Payer: 59 | Admitting: Neurology

## 2014-09-15 ENCOUNTER — Encounter: Payer: Self-pay | Admitting: Emergency Medicine

## 2014-09-15 ENCOUNTER — Emergency Department (INDEPENDENT_AMBULATORY_CARE_PROVIDER_SITE_OTHER): Payer: 59

## 2014-09-15 ENCOUNTER — Emergency Department
Admission: EM | Admit: 2014-09-15 | Discharge: 2014-09-15 | Disposition: A | Discharge status: Home / Self Care | Payer: 59 | Source: Home / Self Care | Attending: Family Medicine | Admitting: Family Medicine

## 2014-09-15 DIAGNOSIS — K0889 Other specified disorders of teeth and supporting structures: Secondary | ICD-10-CM

## 2014-09-15 DIAGNOSIS — R519 Headache, unspecified: Secondary | ICD-10-CM

## 2014-09-15 DIAGNOSIS — R51 Headache: Secondary | ICD-10-CM

## 2014-09-15 DIAGNOSIS — K088 Other specified disorders of teeth and supporting structures: Secondary | ICD-10-CM

## 2014-09-15 HISTORY — DX: Insomnia, unspecified: G47.00

## 2014-09-15 MED ORDER — HYDROCODONE-ACETAMINOPHEN 5-325 MG PO TABS: ORAL_TABLET | ORAL | Status: DC

## 2014-09-15 MED ORDER — MELOXICAM 15 MG PO TABS: 15.0000 mg | ORAL_TABLET | Freq: Every day | ORAL | Status: DC

## 2014-09-15 MED ORDER — AMOXICILLIN 875 MG PO TABS: 875.0000 mg | ORAL_TABLET | Freq: Two times a day (BID) | ORAL | Status: DC

## 2014-09-15 NOTE — ED Provider Notes (Signed)
CSN: [id number]     Arrival date & time 09/15/14  1056 History   First MD Initiated Contact with Patient 09/15/14 1158     Chief Complaint  Patient presents with  . Dental Pain  . Facial Pain      HPI Comments: Patient complains of one week history of right side facial pain involving right ear, right cheek, and right upper teeth.  She feels like her right upper gums are swollen and tender.  No nasal congestion.  No fevers, chills, and sweats.  She has a dental appointment scheduled in two days. The pain has not responded well to ibuprofen 800mg  about every 3 hours.  Patient is a 38 y.o. female presenting with tooth pain. The history is provided by the patient and the spouse.  Dental Pain Location:  Upper Upper teeth location:  5/RU 1st bicuspid, 6/RU cuspid, 7/RU lateral incisor, 8/RU central incisor and 9/LU central incisor Quality:  Dull and aching Severity:  Mild Onset quality:  Gradual Duration:  1 week Timing:  Constant Progression:  Unchanged Chronicity:  Recurrent Previous work-up:  Dental exam Relieved by:  Nothing Worsened by:  Touching and cold food/drink Ineffective treatments:  NSAIDs Associated symptoms: facial pain and gum swelling   Associated symptoms: no congestion, no difficulty swallowing, no drooling, no facial swelling, no fever, no headaches, no neck pain, no neck swelling, no oral bleeding, no oral lesions and no trismus     Past Medical History  Diagnosis Date  . Obesity 01/02/2014  . Asthma, chronic 01/02/2014  . Essential hypertension, benign 01/02/2014  . Insomnia    Past Surgical History  Procedure Laterality Date  . Gallbladder removed  2001  . Breast reduction surgery  2001  . Lipo suction  2012  . Tubal ligation  2010   Family History  Problem Relation Age of Onset  . Hypertension Mother   . Hypertension Maternal Grandmother   . Other Sister     Deceaesd, 14 from blood clot  . Bipolar disorder Sister   . Healthy Son     x2  . Healthy  Daughter     x1   History  Substance Use Topics  . Smoking status: Never Smoker   . Smokeless tobacco: Not on file  . Alcohol Use: No   OB History    No data available     Review of Systems  Constitutional: Negative for fever.  HENT: Negative for congestion, drooling, facial swelling and mouth sores.   Musculoskeletal: Negative for neck pain.  Neurological: Negative for headaches.    Allergies  Review of patient's allergies indicates no known allergies.  Home Medications   Prior to Admission medications   Medication Sig Start Date End Date Taking? Authorizing Provider  phenylephrine (SUDAFED PE) 10 MG TABS tablet Take 10 mg by mouth every 4 (four) hours as needed.   Yes Historical Provider, MD  amoxicillin (AMOXIL) 875 MG tablet Take 1 tablet (875 mg total) by mouth 2 (two) times daily. 09/15/14   Kandra Nicolas, MD  escitalopram (LEXAPRO) 5 MG tablet Take 1 tablet (5 mg total) by mouth daily. 05/02/14   Marcial Pacas, DO  fluticasone (FLONASE) 50 MCG/ACT nasal spray Place 2 sprays into the nose. 12/28/12   Historical Provider, MD  HYDROcodone-acetaminophen (NORCO/VICODIN) 5-325 MG per tablet Take one by mouth at bedtime as needed for pain 09/15/14   Kandra Nicolas, MD  meloxicam (MOBIC) 15 MG tablet Take 1 tablet (15 mg total) by mouth daily.  Take with food each morning 09/15/14   Kandra Nicolas, MD  metoprolol succinate (TOPROL-XL) 50 MG 24 hr tablet Take 1 tablet (50 mg total) by mouth daily. Take with or immediately following a meal. 01/28/14   Sean Hommel, DO  montelukast (SINGULAIR) 10 MG tablet TAKE ONE TABLET BY MOUTH AT BEDTIME 04/23/14   Sean Hommel, DO  nystatin cream (MYCOSTATIN) Apply topically.    Historical Provider, MD  oxymetazoline (AFRIN) 0.05 % nasal spray Place 1 spray into both nostrils 2 (two) times daily. 03/25/14   Sean Hommel, DO  UNABLE TO FIND Apply topically. 06/15/12   Historical Provider, MD  zolpidem (AMBIEN) 10 MG tablet Take 1 tablet (10 mg total) by  mouth at bedtime as needed for sleep. 06/07/14   Sean Hommel, DO   BP 144/89 mmHg  Pulse 67  Temp(Src) 98 F (36.7 C) (Oral)  Resp 6  Ht 5\' 2"  (1.575 m)  Wt 185 lb (83.915 kg)  BMI 33.83 kg/m2  SpO2 100%  LMP 08/29/2014 Physical Exam  Constitutional: She is oriented to person, place, and time. She appears well-developed and well-nourished. No distress.  HENT:  Head: Normocephalic and atraumatic.  Right Ear: Tympanic membrane and external ear normal.  Left Ear: Tympanic membrane and external ear normal.  Nose: Nose normal.  Mouth/Throat: No oropharyngeal exudate.    There is right upper teeth and gingival tenderness to palpation  as noted on diagram. There is distinct tenderness over the right temporomandibular joint.  Palpation there recreates her pain.  .     Eyes: Pupils are equal, round, and reactive to light.  Neck: Neck supple.  Cardiovascular: Normal heart sounds.   Pulmonary/Chest: Breath sounds normal.  Lymphadenopathy:    She has no cervical adenopathy.  Neurological: She is alert and oriented to person, place, and time.  Skin: Skin is warm and dry. No rash noted.  Nursing note and vitals reviewed.   ED Course  Procedures   none   Imaging Review Dg Sinuses Complete  09/15/2014   CLINICAL DATA:  Right-sided facial pain for 1 week  EXAM: PARANASAL SINUSES - COMPLETE 3 + VIEW  COMPARISON:  None.  FINDINGS: The paranasal sinus are aerated. There is no evidence of sinus opacification air-fluid levels or mucosal thickening. No significant bone abnormalities are seen.  IMPRESSION: Negative.   Electronically Signed   By: Conchita Paris M.D.   On: 09/15/2014 13:26     MDM   1. Pain, dental   2. Right sided facial pain    Begin amoxicillin.  Rx for Mobic.  Lortab at bedtime. Try warm salt water gargles, and warm compresses to right face several times daily. Followup with dentist as scheduled in two days.    Kandra Nicolas, MD 09/19/14 [phone]

## 2014-09-15 NOTE — ED Notes (Signed)
Reports pain over right sinuses running from ear to jaw x 1 week; may also have tooth involvement. Took ibuprofen 800mg  at 0900 today.

## 2014-09-15 NOTE — Discharge Instructions (Signed)
Try warm salt water gargles, and warm compresses to right face several times daily.   Dental Pain A tooth ache may be caused by cavities (tooth decay). Cavities expose the nerve of the tooth to air and hot or cold temperatures. It may come from an infection or abscess (also called a boil or furuncle) around your tooth. It is also often caused by dental caries (tooth decay). This causes the pain you are having. DIAGNOSIS  Your caregiver can diagnose this problem by exam. TREATMENT   If caused by an infection, it may be treated with medications which kill germs (antibiotics) and pain medications as prescribed by your caregiver. Take medications as directed.  Only take over-the-counter or prescription medicines for pain, discomfort, or fever as directed by your caregiver.  Whether the tooth ache today is caused by infection or dental disease, you should see your dentist as soon as possible for further care. SEEK MEDICAL CARE IF: The exam and treatment you received today has been provided on an emergency basis only. This is not a substitute for complete medical or dental care. If your problem worsens or new problems (symptoms) appear, and you are unable to meet with your dentist, call or return to this location. SEEK IMMEDIATE MEDICAL CARE IF:   You have a fever.  You develop redness and swelling of your face, jaw, or neck.  You are unable to open your mouth.  You have severe pain uncontrolled by pain medicine. MAKE SURE YOU:   Understand these instructions.  Will watch your condition.  Will get help right away if you are not doing well or get worse. Document Released: 09/27/2005 Document Revised: 12/20/2011 Document Reviewed: 05/15/2008 [facility] Patient Information 2015 [place], [place]. This information is not intended to replace advice given to you by your health care provider. Make sure you discuss any questions you have with your health care provider.

## 2014-09-16 ENCOUNTER — Other Ambulatory Visit: Payer: Self-pay | Admitting: *Deleted

## 2014-09-16 DIAGNOSIS — G4700 Insomnia, unspecified: Secondary | ICD-10-CM

## 2014-09-16 MED ORDER — ZOLPIDEM TARTRATE 10 MG PO TABS: 10.0000 mg | ORAL_TABLET | Freq: Every evening | ORAL | Status: DC | PRN

## 2014-09-23 ENCOUNTER — Ambulatory Visit: Payer: 59 | Admitting: Neurology

## 2014-09-23 ENCOUNTER — Other Ambulatory Visit: Payer: Self-pay | Admitting: Family Medicine

## 2014-09-24 ENCOUNTER — Telehealth: Payer: Self-pay | Admitting: Neurology

## 2014-09-24 ENCOUNTER — Encounter: Payer: Self-pay | Admitting: *Deleted

## 2014-09-24 NOTE — Telephone Encounter (Signed)
Pt no showed 09/23/14 follow up appt w/ Dr. Posey Pronto.  Danae Chen - please send no show letter / Gayleen Orem.

## 2014-09-24 NOTE — Progress Notes (Signed)
No show letter sent for 09/23/2014 

## 2014-10-02 ENCOUNTER — Ambulatory Visit (INDEPENDENT_AMBULATORY_CARE_PROVIDER_SITE_OTHER): Payer: 59 | Admitting: Family Medicine

## 2014-10-02 ENCOUNTER — Encounter: Payer: Self-pay | Admitting: Family Medicine

## 2014-10-02 VITALS — BP 139/81 | HR 75 | Wt 189.0 lb

## 2014-10-02 DIAGNOSIS — I10 Essential (primary) hypertension: Secondary | ICD-10-CM

## 2014-10-02 DIAGNOSIS — G4700 Insomnia, unspecified: Secondary | ICD-10-CM

## 2014-10-02 MED ORDER — ZOLPIDEM TARTRATE 10 MG PO TABS: 10.0000 mg | ORAL_TABLET | Freq: Every evening | ORAL | Status: DC | PRN

## 2014-10-02 MED ORDER — METOPROLOL SUCCINATE ER 100 MG PO TB24: 100.0000 mg | ORAL_TABLET | Freq: Every day | ORAL | Status: DC

## 2014-10-02 NOTE — Progress Notes (Signed)
CC: [name] [name] is a 38 y.o. female is here for Headaches   Subjective: HPI:  Complains of headache that has been occurring for the past few days this week. As localized in the left temple and when present constant described as pressure. She denies any pulsatile quality to it. It is accompanied by photophobia. She denies phonophobia and nausea or vomiting. She denies fevers chills or any recent head injury. She has tried taking aspirin and ibuprofen neither of these have provided any benefit. She experimented by taking an extra 25 mg of her metoprolol and this provided her with 12 hours of headache relief. She tried this one other time and had the same result. Nothing else seems to make headache better or worse. She denies worse headache of her life or waking from the headache. She denies any other motor or sensory disturbances.  Review systems positive for nasal congestion but no sinus pressure or postnasal drip nor cough. Denies sore throat   Review Of Systems Outlined In HPI  Past Medical History  Diagnosis Date  . Obesity 01/02/2014  . Asthma, chronic 01/02/2014  . Essential hypertension, benign 01/02/2014  . Insomnia     Past Surgical History  Procedure Laterality Date  . Gallbladder removed  2001  . Breast reduction surgery  2001  . Lipo suction  2012  . Tubal ligation  2010   Family History  Problem Relation Age of Onset  . Hypertension Mother   . Hypertension Maternal Grandmother   . Other Sister     Deceaesd, 21 from blood clot  . Bipolar disorder Sister   . Healthy Son     x2  . Healthy Daughter     x1    History   Social History  . Marital Status: Married    Spouse Name: N/A    Number of Children: N/A  . Years of Education: N/A   Occupational History  . Not on file.   Social History Main Topics  . Smoking status: Never Smoker   . Smokeless tobacco: Not on file  . Alcohol Use: No  . Drug Use: No  . Sexual Activity:    Partners: Male   Other Topics  Concern  . Not on file   Social History Narrative   She live with husband and three children.   She works part-time at [employer] level of education:  Some college     Objective: BP 139/81 mmHg  Pulse 75  Wt 189 lb (85.73 kg)  LMP 08/29/2014  General: Alert and Oriented, No Acute Distress HEENT: Pupils equal, round, reactive to light. Conjunctivae clear.  External ears unremarkable, canals clear with intact TMs with appropriate landmarks.  Middle ear appears open without effusion. Pink inferior turbinates.  Moist mucous membranes, pharynx without inflammation nor lesions.  Neck supple without palpable lymphadenopathy nor abnormal masses. Cranial nerves II through XII grossly intact Cardiac: Regular rate and rhythm. Normal S1/S2.  No murmurs, rubs, nor gallops.   Extremities: No peripheral edema.  Strong peripheral pulses.  Mental Status: No depression, anxiety, nor agitation. Skin: Warm and dry.  Assessment & Plan: [name] [name] was seen today for headaches.  Diagnoses and associated orders for this visit:  Essential hypertension, benign - metoprolol succinate (TOPROL-XL) 100 MG 24 hr tablet; Take 1 tablet (100 mg total) by mouth daily. Take with or immediately following a meal.  Insomnia - zolpidem (AMBIEN) 10 MG tablet; Take 1 tablet (10 mg total) by mouth at bedtime  as needed for sleep.    Essential hypertension: Uncontrolled chronic condition increasing metoprolol discontinued 50 mg regimen Insomnia: control,She tells me she still gets great relief from Ambien however the last prescription which was faxed to a pharmacy in [place] only allowed her to fill 15 tablets, I suspect this is due to insurance with limitations on how much medication can be filled outside of our [facility]. New written prescription was provided.  Return in about 3 months (around 01/01/2015).

## 2014-10-14 ENCOUNTER — Other Ambulatory Visit: Payer: Self-pay | Admitting: Family Medicine

## 2014-12-16 ENCOUNTER — Encounter: Payer: Self-pay | Admitting: Family Medicine

## 2014-12-16 MED ORDER — MONTELUKAST SODIUM 10 MG PO TABS: 10.0000 mg | ORAL_TABLET | Freq: Every day | ORAL | Status: DC

## 2014-12-16 MED ORDER — ESCITALOPRAM OXALATE 10 MG PO TABS: ORAL_TABLET | ORAL | Status: DC

## 2015-01-13 ENCOUNTER — Other Ambulatory Visit: Payer: Self-pay | Admitting: Family Medicine

## 2015-01-13 DIAGNOSIS — G4700 Insomnia, unspecified: Secondary | ICD-10-CM

## 2015-01-13 MED ORDER — ZOLPIDEM TARTRATE 10 MG PO TABS: 10.0000 mg | ORAL_TABLET | Freq: Every evening | ORAL | Status: DC | PRN

## 2015-02-25 ENCOUNTER — Encounter: Payer: 59 | Admitting: Family Medicine

## 2015-02-27 ENCOUNTER — Ambulatory Visit (INDEPENDENT_AMBULATORY_CARE_PROVIDER_SITE_OTHER): Payer: 59 | Admitting: Family Medicine

## 2015-02-27 ENCOUNTER — Encounter: Payer: Self-pay | Admitting: Family Medicine

## 2015-02-27 VITALS — BP 127/83 | HR 73 | Ht 62.0 in | Wt 192.0 lb

## 2015-02-27 DIAGNOSIS — G4700 Insomnia, unspecified: Secondary | ICD-10-CM | POA: Diagnosis not present

## 2015-02-27 DIAGNOSIS — Z0000 Encounter for general adult medical examination without abnormal findings: Secondary | ICD-10-CM

## 2015-02-27 DIAGNOSIS — I10 Essential (primary) hypertension: Secondary | ICD-10-CM | POA: Diagnosis not present

## 2015-02-27 LAB — CBC
HCT: 38.5 % (ref 36.0–46.0)
Hemoglobin: 12.5 g/dL (ref 12.0–15.0)
MCH: 27.4 pg (ref 26.0–34.0)
MCHC: 32.5 g/dL (ref 30.0–36.0)
MCV: 84.4 fL (ref 78.0–100.0)
MPV: 10.5 fL (ref 8.6–12.4)
Platelets: 278 K/uL (ref 150–400)
RBC: 4.56 MIL/uL (ref 3.87–5.11)
RDW: 13.4 % (ref 11.5–15.5)
WBC: 6.6 K/uL (ref 4.0–10.5)

## 2015-02-27 LAB — COMPREHENSIVE METABOLIC PANEL WITH GFR
ALT: 15 U/L (ref 0–35)
AST: 14 U/L (ref 0–37)
Albumin: 4.2 g/dL (ref 3.5–5.2)
Alkaline Phosphatase: 49 U/L (ref 39–117)
BUN: 8 mg/dL (ref 6–23)
CO2: 26 meq/L (ref 19–32)
Calcium: 9.6 mg/dL (ref 8.4–10.5)
Chloride: 106 meq/L (ref 96–112)
Creat: 0.78 mg/dL (ref 0.50–1.10)
Glucose, Bld: 103 mg/dL — ABNORMAL HIGH (ref 70–99)
Potassium: 4.7 meq/L (ref 3.5–5.3)
Sodium: 140 meq/L (ref 135–145)
Total Bilirubin: 0.7 mg/dL (ref 0.2–1.2)
Total Protein: 7.2 g/dL (ref 6.0–8.3)

## 2015-02-27 LAB — LIPID PANEL
Cholesterol: 147 mg/dL (ref 0–200)
HDL: 43 mg/dL — AB (ref >=46)
LDL CALC: 92 mg/dL (ref 0–99)
Total CHOL/HDL Ratio: 3.4 Ratio
Triglycerides: 60 mg/dL (ref <150)
VLDL: 12 mg/dL (ref 0–40)

## 2015-02-27 LAB — TSH: TSH: 1.135 u[IU]/mL (ref 0.350–4.500)

## 2015-02-27 MED ORDER — ESCITALOPRAM OXALATE 10 MG PO TABS: ORAL_TABLET | ORAL | Status: DC

## 2015-02-27 MED ORDER — MONTELUKAST SODIUM 10 MG PO TABS: 10.0000 mg | ORAL_TABLET | Freq: Every day | ORAL | Status: DC

## 2015-02-27 MED ORDER — ZOLPIDEM TARTRATE 10 MG PO TABS: 10.0000 mg | ORAL_TABLET | Freq: Every evening | ORAL | Status: DC | PRN

## 2015-02-27 MED ORDER — METOPROLOL SUCCINATE ER 100 MG PO TB24: 100.0000 mg | ORAL_TABLET | Freq: Every day | ORAL | Status: DC

## 2015-02-27 NOTE — Progress Notes (Signed)
CC: [name] [name] is a 39 y.o. female is here for Annual Exam   Subjective: HPI:  Colonoscopy: No current indication Papsmear: UTD two months ago, normal Mammogram: Discussed screening risks/benefis for starting at age 91-50,  She would prefer to start screening on her 18th birthday, orders have been placed.  Influenza Vaccine: no current indication Pneumovax: no current indication Td/Tdap: up-to-date Zoster: (Start 40 yo)  Requesting complete physical exam, she is also asking for PTPTT, factor V Leiden to be checked for upcoming tummy tuck plastic surgery procedure, [facility]  Review of Systems - General ROS: negative for - chills, fever, night sweats, weight gain or weight loss Ophthalmic ROS: negative for - decreased vision Psychological ROS: negative for - anxiety or depression ENT ROS: negative for - hearing change, nasal congestion, tinnitus or allergies Hematological and Lymphatic ROS: negative for - bleeding problems, bruising or swollen lymph nodes Breast ROS: negative Respiratory ROS: no cough, shortness of breath, or wheezing Cardiovascular ROS: no chest pain or dyspnea on exertion Gastrointestinal ROS: no abdominal pain, change in bowel habits, or black or bloody stools Genito-Urinary ROS: negative for - genital discharge, genital ulcers, incontinence or abnormal bleeding from genitals Musculoskeletal ROS: negative for - joint pain or muscle pain Neurological ROS: negative for - headaches or memory loss Dermatological ROS: negative for lumps, mole changes, rash and skin lesion changes  Past Medical History  Diagnosis Date  . Obesity 01/02/2014  . Asthma, chronic 01/02/2014  . Essential hypertension, benign 01/02/2014  . Insomnia     Past Surgical History  Procedure Laterality Date  . Gallbladder removed  2001  . Breast reduction surgery  2001  . Lipo suction  2012  . Tubal ligation  2010   Family  History  Problem Relation Age of Onset  . Hypertension Mother   . Hypertension Maternal Grandmother   . Other Sister     Deceaesd, 63 from blood clot  . Bipolar disorder Sister   . Healthy Son     x2  . Healthy Daughter     x1    History   Social History  . Marital Status: Married    Spouse Name: N/A  . Number of Children: N/A  . Years of Education: N/A   Occupational History  . Not on file.   Social History Main Topics  . Smoking status: Never Smoker   . Smokeless tobacco: Not on file  . Alcohol Use: No  . Drug Use: No  . Sexual Activity:    Partners: Male   Other Topics Concern  . Not on file   Social History Narrative   She live with husband and three children.   She works part-time at [employer] level of education:  Some college     Objective: BP 127/83 mmHg  Pulse 73  Ht 5' 2"  (1.575 m)  Wt 192 lb (87.091 kg)  BMI 35.11 kg/m2  General: No Acute Distress HEENT: Atraumatic, normocephalic, conjunctivae normal without scleral icterus.  No nasal discharge, hearing grossly intact, TMs with good landmarks bilaterally with no middle ear abnormalities, posterior pharynx clear without oral lesions. Neck: Supple, trachea midline, no cervical nor supraclavicular adenopathy. Pulmonary: Clear to auscultation bilaterally without wheezing, rhonchi, nor rales. Cardiac: Regular rate and rhythm.  No murmurs, rubs, nor gallops. No peripheral edema.  2+ peripheral pulses bilaterally. Abdomen: Bowel sounds normal.  No masses.  Non-tender without rebound.  Negative  Murphy's sign. XG:[id number] by her GYN MSK: Grossly intact, no signs of weakness.  Full strength throughout upper and lower extremities.  Full ROM in upper and lower extremities.  No midline spinal tenderness. Neuro: Gait unremarkable, CN II-XII grossly intact.  C5-C6 Reflex 2/4 Bilaterally, L4 Reflex 2/4 Bilaterally.  Cerebellar function intact. Skin: No rashes. Psych: Alert and oriented to  person/place/time.  Thought process normal. No anxiety/depression.  Assessment & Plan: [name] [name] was seen today for annual exam.  Diagnoses and all orders for this visit:  Annual physical exam Orders: -     CBC -     TSH -     Comp Met (CMET) -     Lipid panel -     MM DIGITAL SCREENING BILATERAL; Future -     Factor V Leiden -     PT AND PTT  Essential hypertension, benign Orders: -     metoprolol succinate (TOPROL-XL) 100 MG 24 hr tablet; Take 1 tablet (100 mg total) by mouth daily. Take with or immediately following a meal.  Insomnia Orders: -     zolpidem (AMBIEN) 10 MG tablet; Take 1 tablet (10 mg total) by mouth at bedtime as needed for sleep.  Other orders -     escitalopram (LEXAPRO) 10 MG tablet; TAKE 1 TABLET (10 MG TOTAL) BY MOUTH DAILY. Follow up for refills. -     montelukast (SINGULAIR) 10 MG tablet; Take 1 tablet (10 mg total) by mouth at bedtime.  Healthy lifestyle interventions including but not limited to regular exercise, a healthy low fat diet, moderation of salt intake, the dangers of tobacco/alcohol/recreational drug use, nutrition supplementation, and accident avoidance were discussed with the patient and a handout was provided for future reference.  Due for refills on all above medications. Results will need to be faxed to [facility]  Return in about 3 months (around 05/30/2015) for BP and Mood.

## 2015-03-03 ENCOUNTER — Telehealth: Payer: Self-pay | Admitting: Family Medicine

## 2015-03-03 DIAGNOSIS — R7301 Impaired fasting glucose: Secondary | ICD-10-CM | POA: Insufficient documentation

## 2015-03-03 NOTE — Telephone Encounter (Signed)
Pt.notified

## 2015-03-03 NOTE — Telephone Encounter (Signed)
Seth Bake, Will you please let patient know that her Liver function, kidney function, cholesterol, and blood cell counts are normal.  Her fasting blood sugar was just barely elevated therefore I'd recommend having an A1c checked.  Lab order in your inbox.  Will you also let her know that her PT/PTT and Factor 5 Liden order is still pending (will you check with the lab to see if this is still being processed?).

## 2015-03-04 NOTE — Telephone Encounter (Signed)
fo some reason it wasn't collected and the person I talked with at [facility] couldn't see the order. So another order can be placed with the a1c for her to get it drawn then

## 2015-03-04 NOTE — Telephone Encounter (Signed)
[name] [name], Any updates from the lab?

## 2015-03-05 NOTE — Addendum Note (Signed)
Addended by: Marcial Pacas on: 03/05/2015 07:49 AM   Modules accepted: Orders

## 2015-03-05 NOTE — Telephone Encounter (Signed)
Thank you,new orders in your inbox.

## 2015-03-12 LAB — PROTIME-INR
INR: 0.96 (ref <1.50)
Prothrombin Time: 12.8 seconds (ref 11.6–15.2)

## 2015-03-12 LAB — HEMOGLOBIN A1C
Hgb A1c MFr Bld: 5.4 % (ref <5.7)
Mean Plasma Glucose: 108 mg/dL (ref <117)

## 2015-03-12 LAB — APTT: aPTT: 30 seconds (ref 24–37)

## 2015-03-15 LAB — FACTOR 5 LEIDEN

## 2015-03-19 ENCOUNTER — Encounter: Payer: Self-pay | Admitting: Family Medicine

## 2015-03-20 ENCOUNTER — Encounter: Payer: Self-pay | Admitting: Family Medicine

## 2015-03-20 ENCOUNTER — Telehealth: Payer: Self-pay | Admitting: Family Medicine

## 2015-03-20 NOTE — Telephone Encounter (Signed)
-----   Message -----      From: Melvyn Neth     Sent: 03/19/2015  6:09 PM      To: [facility] Clinical Pool    Subject: Non-Urgent Medical Question                 I just wanted to know was my blood test [facility] Fax [phone] Ty Hilts, Can you update the patient regarding her question, I think I put these results in your inbox earlier this week or late last week.

## 2015-03-20 NOTE — Telephone Encounter (Signed)
Labs have been faxed.

## 2015-03-25 ENCOUNTER — Encounter: Payer: Self-pay | Admitting: Family Medicine

## 2015-04-08 ENCOUNTER — Other Ambulatory Visit: Payer: Self-pay | Admitting: Family Medicine

## 2015-04-10 ENCOUNTER — Other Ambulatory Visit: Payer: Self-pay

## 2015-04-10 MED ORDER — ESCITALOPRAM OXALATE 10 MG PO TABS: ORAL_TABLET | ORAL | Status: DC

## 2015-04-24 ENCOUNTER — Ambulatory Visit (INDEPENDENT_AMBULATORY_CARE_PROVIDER_SITE_OTHER): Payer: 59 | Admitting: Obstetrics & Gynecology

## 2015-04-24 ENCOUNTER — Encounter: Payer: Self-pay | Admitting: Obstetrics & Gynecology

## 2015-04-24 VITALS — BP 131/84 | HR 71 | Resp 16 | Ht 62.0 in | Wt 190.0 lb

## 2015-04-24 DIAGNOSIS — D251 Intramural leiomyoma of uterus: Secondary | ICD-10-CM | POA: Diagnosis not present

## 2015-04-24 MED ORDER — NAPROXEN 500 MG PO TABS: 500.0000 mg | ORAL_TABLET | Freq: Two times a day (BID) | ORAL | Status: DC

## 2015-04-24 NOTE — Progress Notes (Signed)
   CLINIC ENCOUNTER NOTE  History:  39 y.o. [id number] here today for discussion about surgery for fibroid. Had recent ultrasound at [facility] that showed a 6.7 cm intramural anterior fibroid.  She is interested in myomectomy.  The fibroids causes her pain, dysmenorrhea and pressure on her bladder. She denies any abnormal vaginal discharge, bleeding, or other concerns.   Past Medical History  Diagnosis Date  . Obesity 01/02/2014  . Asthma, chronic 01/02/2014  . Essential hypertension, benign 01/02/2014  . Insomnia     Past Surgical History  Procedure Laterality Date  . Gallbladder removed  2001  . Breast reduction surgery  2001  . Lipo suction  2012  . Tubal ligation  2010    The following portions of the patient's history were reviewed and updated as appropriate: allergies, current medications, past family history, past medical history, past social history, past surgical history and problem list.   Health Maintenance:  Normal pap in 2015 at PCP's office.  Review of Systems:  Pertinent items are noted in HPI. Comprehensive review of systems was otherwise negative.  Objective:  Physical Exam BP 131/84 mmHg  Pulse 71  Resp 16  Ht 5\' 2"  (1.575 m)  Wt 190 lb (86.183 kg)  BMI 34.74 kg/m2  LMP 03/30/2015 CONSTITUTIONAL: Well-developed, well-nourished female in no acute distress.  HENT:  Normocephalic, atraumatic. External right and left ear normal. Oropharynx is clear and moist EYES: Conjunctivae and EOM are normal. Pupils are equal, round, and reactive to light. No scleral icterus.  NECK: Normal range of motion, supple, no masses SKIN: Skin is warm and dry. No rash noted. Not diaphoretic. No erythema. No pallor. [place]: Alert and oriented to person, place, and time. Normal reflexes, muscle tone coordination. No cranial nerve deficit noted. PSYCHIATRIC: Normal mood and affect. Normal behavior. Normal judgment and thought content. CARDIOVASCULAR: Normal heart rate noted RESPIRATORY:  Effort and breath sounds normal, no problems with respiration noted ABDOMEN: Soft, no distention noted, tender enlarged uterus palpated.Marland Kitchen   PELVIC: Deferred MUSCULOSKELETAL: Normal range of motion. No edema noted.  Labs and Imaging As mentioned in HPI.   Assessment & Plan:  Discussed myomectomy: open vs laparoscopic/robot assisted. Risks reviewed in detail. She wants to proceed with minimally invasive surgery. She was referred to Dr. Governor Specking for evaluation and surgical management.   Naproxen prescribed as needed for pain.  Routine preventative health maintenance measures emphasized. Please refer to After Visit Summary for other counseling recommendations.    Verita Schneiders, MD, [place] Attending [place] for [employer], [place]

## 2015-04-24 NOTE — Patient Instructions (Signed)
Myomectomy Myomectomy is surgery to remove a noncancerous tumor (myoma) from the uterus. Myomas are tumors made up of fibrous tissue. They are often called fibroid tumors. Fibroid tumors can range from the size of a pea to the size of a grapefruit. In a myomectomy, the fibroid tumor is removed without removing the uterus. Because these tumors are rarely cancerous, this surgery is usually done only if the tumor is growing or causing symptoms such as pain, pressure, bleeding, or pain with intercourse. LET [facility] CARE PROVIDER KNOW ABOUT:  Any allergies you have.  All medicines you are taking, including vitamins, herbs, eye drops, creams, and over-the-counter medicines.  Previous problems you or members of your family have had with the use of anesthetics.  Any blood disorders you have.  Previous surgeries you have had.  Medical conditions you have. RISKS AND COMPLICATIONS  Generally, this is a safe procedure. However, as with any procedure, complications can occur. Possible complications include:  Excessive bleeding.  Infection.  Injury to nearby organs.  Blood clots in the legs, chest, or brain.  Scar tissue on other organs and in the pelvis. This may require another surgery to remove the scar tissue. BEFORE THE PROCEDURE  Ask your health care provider about changing or stopping your regular medicines. Avoid taking aspirin or blood thinners as directed by your health care provider.  Do not  eat or drink anything after midnight on the night before surgery.  If you smoke, do not  smoke for 2 weeks before the surgery.  Do not  drink alcohol the day before the surgery.  Arrange for someone to drive you home after the procedure or after your hospital stay. Also arrange for someone to help you with activities during your recovery. PROCEDURE You will be given medicine to make you sleep through the procedure (general anesthetic). Any of the following methods may be used to perform a  myomectomy:  Small monitors will be put on your body. They are used to check your heart, blood pressure, and oxygen level.  An IV access tube will be put into one of your veins. Medicine will be able to flow directly into your body through this IV tube.  You might be given a medicine to help you relax (sedative).  You will be given a medicine to make you sleep (general anesthetic). A breathing tube will be placed into your lungs during the procedure.  A thin, flexible tube (catheter) will be inserted into your bladder to collect urine.  Any of the following methods may be used to perform a myomectomy:  Hysteroscopic myomectomy--This method may be used when the fibroid tumor is inside the cavity of the uterus. A long, thin tube that is like a telescope (hysteroscope) is inserted inside the uterus. A saline solution is put into your uterus. This expands the uterus and allows the surgeon to see the fibroids. Tools are passed through the hysteroscope to remove the fibroid tumor in pieces.  Laparoscopic myomectomy--A few small cuts (incisions) are made in the lower abdomen. A thin, lighted tube with a tiny camera on the end (laparoscope) is inserted through one of the incisions. This gives the surgeon a good view of the area. The fibroid tumor is removed through the other incisions. The incisions are then closed with stitches (sutures) or staples.  Abdominal myomectomy--This method is used when the fibroid tumor cannot be removed with a hysteroscope or laparoscope. The surgery is performed through a larger surgical incision in the abdomen. The  fibroid tumor is removed through this incision. The incision is closed with sutures or staples. AFTER THE PROCEDURE  If you had a laparoscopic or hysteroscopic myomectomy, you may be able to go home the same day, or you may need to stay in the hospital overnight.  If you had an abdominal myomectomy, you may need to stay in the hospital for a few days.  Your  IV access tube and catheter will be removed in 1-2 days.  You may be given medicine for pain or to help you sleep.  You may be given an antibiotic medicine, if needed. Document Released: 07/25/2007 Document Revised: 07/18/2013 Document Reviewed: 05/09/2013 [facility] Patient Information 2015 [place], [place]. This information is not intended to replace advice given to you by your health care provider. Make sure you discuss any questions you have with your health care provider.

## 2015-05-09 ENCOUNTER — Other Ambulatory Visit: Payer: Self-pay | Admitting: Family Medicine

## 2015-05-09 DIAGNOSIS — G4700 Insomnia, unspecified: Secondary | ICD-10-CM

## 2015-05-09 MED ORDER — ZOLPIDEM TARTRATE 10 MG PO TABS: 10.0000 mg | ORAL_TABLET | Freq: Every evening | ORAL | Status: DC | PRN

## 2015-05-10 ENCOUNTER — Other Ambulatory Visit: Payer: Self-pay | Admitting: Family Medicine

## 2015-05-30 ENCOUNTER — Ambulatory Visit (INDEPENDENT_AMBULATORY_CARE_PROVIDER_SITE_OTHER): Payer: 59 | Admitting: Family Medicine

## 2015-05-30 ENCOUNTER — Encounter: Payer: Self-pay | Admitting: Family Medicine

## 2015-05-30 VITALS — BP 132/82 | HR 64 | Wt 194.0 lb

## 2015-05-30 DIAGNOSIS — M25561 Pain in right knee: Secondary | ICD-10-CM | POA: Diagnosis not present

## 2015-05-30 DIAGNOSIS — I10 Essential (primary) hypertension: Secondary | ICD-10-CM

## 2015-05-30 DIAGNOSIS — G4700 Insomnia, unspecified: Secondary | ICD-10-CM | POA: Diagnosis not present

## 2015-05-30 DIAGNOSIS — F411 Generalized anxiety disorder: Secondary | ICD-10-CM

## 2015-05-30 MED ORDER — ESCITALOPRAM OXALATE 20 MG PO TABS: ORAL_TABLET | ORAL | Status: DC

## 2015-05-30 MED ORDER — DICLOFENAC SODIUM 2 % TD SOLN: TRANSDERMAL | Status: DC

## 2015-05-30 MED ORDER — METOPROLOL SUCCINATE ER 100 MG PO TB24: 100.0000 mg | ORAL_TABLET | Freq: Every day | ORAL | Status: DC

## 2015-05-30 MED ORDER — ZOLPIDEM TARTRATE 10 MG PO TABS: 10.0000 mg | ORAL_TABLET | Freq: Every evening | ORAL | Status: DC | PRN

## 2015-05-30 NOTE — Progress Notes (Signed)
CC: [name] [name] is a 39 y.o. female is here for Hypertension and f/u mood   Subjective: HPI:  Follow-up anxiety: Continues to take Lexapro 10 mg on a daily basis, she thinks that this is helping some with her anxiety. Her daughter recently went to [employer] and not having her around the house and 3 hours away is stressing out the patient. She states that she thinks about this daily and was thinking about it daily for a few weeks coming up to this moment. Symptoms are interfering with quality of life and she wants something that can be done about it. She denies anxiety about anything else. She denies depression or any known side effects from Lexapro  Follow-up essential hypertension: She continues on Toprol daily without any known side effects. No chest pain shortness of breath or peripheral edema. No outside blood pressures to report  Follow-up insomnia: She is requesting a refill on Ambien she does not need it yet to be filled but would like to save her from having to come back later this month. She takes it on nightly basis and states it works great to help her stay asleep and fall asleep without any known side effects  Complains of right knee pain it's been present for matter. Spirits localized to just below the kneecap a sharp little nodule under the skin. She remembers when she was younger having her knee dislocated but denies any recent or other remote injuries. Currently its new since if she is down on her knees or getting from a seated to standing position. Denies any swelling redness or warmth   Review Of Systems Outlined In HPI  Past Medical History  Diagnosis Date  . Obesity 01/02/2014  . Asthma, chronic 01/02/2014  . Essential hypertension, benign 01/02/2014  . Insomnia     Past Surgical History  Procedure Laterality Date  . Gallbladder removed  2001  . Breast reduction surgery  2001  . Lipo suction  2012  . Tubal ligation  2010   Family History  Problem Relation Age of  Onset  . Hypertension Mother   . Hypertension Maternal Grandmother   . Other Sister     Deceaesd, 40 from blood clot  . Bipolar disorder Sister   . Healthy Son     x2  . Healthy Daughter     x1    Social History   Social History  . Marital Status: Married    Spouse Name: N/A  . Number of Children: N/A  . Years of Education: N/A   Occupational History  . Not on file.   Social History Main Topics  . Smoking status: Never Smoker   . Smokeless tobacco: Not on file  . Alcohol Use: No  . Drug Use: No  . Sexual Activity:    Partners: Male   Other Topics Concern  . Not on file   Social History Narrative   She live with husband and three children.   She works part-time at [employer] level of education:  Some college     Objective: BP 132/82 mmHg  Pulse 64  Wt 194 lb (87.998 kg)  Vital signs reviewed. General: Alert and Oriented, No Acute Distress HEENT: Pupils equal, round, reactive to light. Conjunctivae clear.  External ears unremarkable.  Moist mucous membranes. Lungs: Clear and comfortable work of breathing, speaking in full sentences without accessory muscle use. Cardiac: Regular rate and rhythm.  Neuro: CN II-XII grossly intact, gait normal. Extremities: No  peripheral edema.  Strong peripheral pulses. Full range of motion and strength of the right knee, no pain with palpation of the poles of the kneecap nor with the patellar tendon or its insertions. She has a 4 mm diameter firm fixed tender nodule that appears to be attached to the tibial tuberosity. Mental Status: No depression, anxiety, nor agitation. Logical though process. Skin: Warm and dry.  Assessment & Plan: [name] [name] was seen today for hypertension and f/u mood.  Diagnoses and all orders for this visit:  Essential hypertension, benign -     metoprolol succinate (TOPROL-XL) 100 MG 24 hr tablet; Take 1 tablet (100 mg total) by mouth daily. Take with or immediately following a  meal.  Generalized anxiety disorder -     escitalopram (LEXAPRO) 20 MG tablet; One by mouth daily.  Insomnia -     zolpidem (AMBIEN) 10 MG tablet; Take 1 tablet (10 mg total) by mouth at bedtime as needed for sleep.  Right knee pain  Other orders -     Diclofenac Sodium (PENNSAID) 2 % SOLN; Apply to right knee TID PRN pain.   Essential hypertension: Controlled continue metoprolol Generalized anxiety disorder: Uncontrolled chronic condition increasing Lexapro Insomnia: Controlled with Ambien Right knee pain when looking at this underneath ultrasound appears that there is some calcification from chronic tendinitis at the tibial tuberosity. Discussed benign nature of this finding and that we can try using topical NSAID on an as-needed basis, I gave her samples and asked her to call me if she would like a prescription sent to the med center in [facility].  Return in about 3 months (around 08/30/2015).

## 2015-06-19 ENCOUNTER — Other Ambulatory Visit: Payer: Self-pay | Admitting: Obstetrics & Gynecology

## 2015-06-25 ENCOUNTER — Encounter (HOSPITAL_COMMUNITY)
Admission: RE | Admit: 2015-06-25 | Discharge: 2015-06-25 | Disposition: A | Discharge status: Home / Self Care | Payer: 59 | Source: Ambulatory Visit | Attending: Obstetrics & Gynecology | Admitting: Obstetrics & Gynecology

## 2015-06-25 ENCOUNTER — Encounter (HOSPITAL_COMMUNITY): Payer: Self-pay

## 2015-06-25 ENCOUNTER — Other Ambulatory Visit: Payer: Self-pay

## 2015-06-25 DIAGNOSIS — N920 Excessive and frequent menstruation with regular cycle: Secondary | ICD-10-CM | POA: Diagnosis not present

## 2015-06-25 DIAGNOSIS — Z01818 Encounter for other preprocedural examination: Secondary | ICD-10-CM | POA: Insufficient documentation

## 2015-06-25 DIAGNOSIS — R102 Pelvic and perineal pain: Secondary | ICD-10-CM | POA: Insufficient documentation

## 2015-06-25 DIAGNOSIS — D259 Leiomyoma of uterus, unspecified: Secondary | ICD-10-CM | POA: Diagnosis not present

## 2015-06-25 HISTORY — DX: Anxiety disorder, unspecified: F41.9

## 2015-06-25 LAB — CBC
HCT: 39 % (ref 36.0–46.0)
Hemoglobin: 12.8 g/dL (ref 12.0–15.0)
MCH: 28.1 pg (ref 26.0–34.0)
MCHC: 32.8 g/dL (ref 30.0–36.0)
MCV: 85.7 fL (ref 78.0–100.0)
PLATELETS: 260 10*3/uL (ref 150–400)
RBC: 4.55 MIL/uL (ref 3.87–5.11)
RDW: 13.3 % (ref 11.5–15.5)
WBC: 9.1 10*3/uL (ref 4.0–10.5)

## 2015-06-25 LAB — COMPREHENSIVE METABOLIC PANEL
ALT: 25 U/L (ref 14–54)
ANION GAP: 10 (ref 5–15)
AST: 19 U/L (ref 15–41)
Albumin: 4.3 g/dL (ref 3.5–5.0)
Alkaline Phosphatase: 53 U/L (ref 38–126)
BUN: 8 mg/dL (ref 6–20)
CHLORIDE: 102 mmol/L (ref 101–111)
CO2: 26 mmol/L (ref 22–32)
CREATININE: 0.8 mg/dL (ref 0.44–1.00)
Calcium: 9.7 mg/dL (ref 8.9–10.3)
GFR calc Af Amer: >60 mL/min (ref >60)
Glucose, Bld: 126 mg/dL — ABNORMAL HIGH (ref 65–99)
Potassium: 4.5 mmol/L (ref 3.5–5.1)
Sodium: 138 mmol/L (ref 135–145)
Total Bilirubin: 1 mg/dL (ref 0.3–1.2)
Total Protein: 7.7 g/dL (ref 6.5–8.1)

## 2015-06-25 LAB — TYPE AND SCREEN
ABO/RH(D): O POS
ANTIBODY SCREEN: NEGATIVE

## 2015-06-25 LAB — ABO/RH: ABO/RH(D): O POS

## 2015-06-25 NOTE — Patient Instructions (Signed)
Your procedure is scheduled on:06/30/15  Enter through the Main Entrance at :19 am Pick up desk phone and dial [phone] and inform us of your arrival.  Please call [phone] if you have any problems the morning of surgery.  Remember: Do not eat food after midnight:Sunday Clear liquids are ok until: 9am on Monday   You may brush your teeth the morning of surgery.  Take these meds the morning of surgery with a sip of water:BP med  DO NOT wear jewelry, eye make-up, lipstick,body lotion, or dark fingernail polish.  (Polished toes are ok) You may wear deodorant.  If you are to be admitted after surgery, leave suitcase in car until your room has been assigned. Patients discharged on the day of surgery will not be allowed to drive home. Wear loose fitting, comfortable clothes for your ride home.

## 2015-06-30 ENCOUNTER — Encounter (HOSPITAL_COMMUNITY)
Admission: AD | Disposition: A | Discharge status: Home / Self Care | Payer: Self-pay | Source: Ambulatory Visit | Attending: Obstetrics & Gynecology

## 2015-06-30 ENCOUNTER — Ambulatory Visit (HOSPITAL_COMMUNITY): Payer: 59 | Admitting: Anesthesiology

## 2015-06-30 ENCOUNTER — Encounter (HOSPITAL_COMMUNITY): Payer: Self-pay | Admitting: Certified Registered Nurse Anesthetist

## 2015-06-30 ENCOUNTER — Inpatient Hospital Stay (HOSPITAL_COMMUNITY)
Admission: AD | Admit: 2015-06-30 | Discharge: 2015-07-01 | DRG: 743 | Disposition: A | Discharge status: Home / Self Care | Payer: 59 | Source: Ambulatory Visit | Attending: Obstetrics & Gynecology | Admitting: Obstetrics & Gynecology

## 2015-06-30 DIAGNOSIS — D251 Intramural leiomyoma of uterus: Secondary | ICD-10-CM | POA: Diagnosis present

## 2015-06-30 DIAGNOSIS — Z9889 Other specified postprocedural states: Secondary | ICD-10-CM

## 2015-06-30 DIAGNOSIS — J45909 Unspecified asthma, uncomplicated: Secondary | ICD-10-CM | POA: Diagnosis present

## 2015-06-30 DIAGNOSIS — R102 Pelvic and perineal pain: Secondary | ICD-10-CM | POA: Diagnosis present

## 2015-06-30 DIAGNOSIS — E669 Obesity, unspecified: Secondary | ICD-10-CM | POA: Diagnosis present

## 2015-06-30 DIAGNOSIS — I10 Essential (primary) hypertension: Secondary | ICD-10-CM | POA: Diagnosis present

## 2015-06-30 DIAGNOSIS — Z6834 Body mass index (BMI) 34.0-34.9, adult: Secondary | ICD-10-CM

## 2015-06-30 DIAGNOSIS — N920 Excessive and frequent menstruation with regular cycle: Principal | ICD-10-CM | POA: Diagnosis present

## 2015-06-30 DIAGNOSIS — Z8249 Family history of ischemic heart disease and other diseases of the circulatory system: Secondary | ICD-10-CM | POA: Diagnosis not present

## 2015-06-30 LAB — PREGNANCY, URINE: PREG TEST UR: NEGATIVE

## 2015-06-30 SURGERY — ROBOTIC ASSISTED TOTAL HYSTERECTOMY WITH SALPINGECTOMY
Anesthesia: General | Site: Abdomen | Laterality: Bilateral

## 2015-06-30 MED ORDER — FENTANYL CITRATE (PF) 100 MCG/2ML IJ SOLN
INTRAMUSCULAR | Status: DC | PRN
  Administered 2015-06-30: 50 ug via INTRAVENOUS
  Administered 2015-06-30: 100 ug via INTRAVENOUS
  Administered 2015-06-30 (×4): 50 ug via INTRAVENOUS

## 2015-06-30 MED ORDER — ARTIFICIAL TEARS OP OINT
TOPICAL_OINTMENT | OPHTHALMIC | Status: AC
  Filled 2015-06-30: qty 3.5

## 2015-06-30 MED ORDER — MIDAZOLAM HCL 2 MG/2ML IJ SOLN
INTRAMUSCULAR | Status: AC
  Filled 2015-06-30: qty 4

## 2015-06-30 MED ORDER — ROPIVACAINE HCL 5 MG/ML IJ SOLN
INTRAMUSCULAR | Status: DC | PRN
  Administered 2015-06-30: 60 mL

## 2015-06-30 MED ORDER — MIDAZOLAM HCL 2 MG/2ML IJ SOLN
INTRAMUSCULAR | Status: DC | PRN
  Administered 2015-06-30: 2 mg via INTRAVENOUS

## 2015-06-30 MED ORDER — FENTANYL CITRATE (PF) 100 MCG/2ML IJ SOLN
25.0000 ug | INTRAMUSCULAR | Status: DC | PRN
  Administered 2015-06-30 (×4): 50 ug via INTRAVENOUS

## 2015-06-30 MED ORDER — BUPIVACAINE HCL (PF) 0.25 % IJ SOLN
INTRAMUSCULAR | Status: DC | PRN
  Administered 2015-06-30: 20 mL

## 2015-06-30 MED ORDER — SCOPOLAMINE 1 MG/3DAYS TD PT72
MEDICATED_PATCH | TRANSDERMAL | Status: AC
  Administered 2015-06-30: 1.5 mg via TRANSDERMAL
  Filled 2015-06-30: qty 1

## 2015-06-30 MED ORDER — PROPOFOL 10 MG/ML IV BOLUS
INTRAVENOUS | Status: AC
  Filled 2015-06-30: qty 20

## 2015-06-30 MED ORDER — NEOSTIGMINE METHYLSULFATE 10 MG/10ML IV SOLN
INTRAVENOUS | Status: AC
  Filled 2015-06-30: qty 1

## 2015-06-30 MED ORDER — LIDOCAINE HCL (CARDIAC) 20 MG/ML IV SOLN
INTRAVENOUS | Status: DC | PRN
  Administered 2015-06-30: 50 mg via INTRAVENOUS

## 2015-06-30 MED ORDER — BUPIVACAINE HCL (PF) 0.25 % IJ SOLN
INTRAMUSCULAR | Status: AC
  Filled 2015-06-30: qty 30

## 2015-06-30 MED ORDER — FENTANYL CITRATE (PF) 100 MCG/2ML IJ SOLN
INTRAMUSCULAR | Status: AC
  Filled 2015-06-30: qty 2

## 2015-06-30 MED ORDER — GLYCOPYRROLATE 0.2 MG/ML IJ SOLN
INTRAMUSCULAR | Status: AC
Start: 2015-06-30 — End: 2015-06-30
  Filled 2015-06-30: qty 3

## 2015-06-30 MED ORDER — KETOROLAC TROMETHAMINE 30 MG/ML IJ SOLN
INTRAMUSCULAR | Status: AC
  Filled 2015-06-30: qty 1

## 2015-06-30 MED ORDER — ONDANSETRON HCL 4 MG/2ML IJ SOLN
INTRAMUSCULAR | Status: AC
  Filled 2015-06-30: qty 2

## 2015-06-30 MED ORDER — HYDROMORPHONE HCL 1 MG/ML IJ SOLN
1.0000 mg | INTRAMUSCULAR | Status: DC | PRN
  Administered 2015-06-30 – 2015-07-01 (×3): 1 mg via INTRAVENOUS
  Filled 2015-06-30 (×3): qty 1

## 2015-06-30 MED ORDER — LACTATED RINGERS IV SOLN
INTRAVENOUS | Status: DC
  Administered 2015-06-30 – 2015-07-01 (×2): via INTRAVENOUS

## 2015-06-30 MED ORDER — LACTATED RINGERS IV SOLN
INTRAVENOUS | Status: DC
  Administered 2015-06-30: 12:00:00 via INTRAVENOUS

## 2015-06-30 MED ORDER — ROCURONIUM BROMIDE 100 MG/10ML IV SOLN
INTRAVENOUS | Status: DC | PRN
  Administered 2015-06-30: 20 mg via INTRAVENOUS
  Administered 2015-06-30: 50 mg via INTRAVENOUS
  Administered 2015-06-30: 20 mg via INTRAVENOUS

## 2015-06-30 MED ORDER — LIDOCAINE HCL (PF) 1 % IJ SOLN
INTRAMUSCULAR | Status: AC
  Filled 2015-06-30: qty 5

## 2015-06-30 MED ORDER — KETOROLAC TROMETHAMINE 30 MG/ML IJ SOLN
INTRAMUSCULAR | Status: DC | PRN
Start: 2015-06-30 — End: 2015-06-30
  Administered 2015-06-30: 30 mg via INTRAVENOUS

## 2015-06-30 MED ORDER — PROPOFOL 10 MG/ML IV BOLUS
INTRAVENOUS | Status: DC | PRN
  Administered 2015-06-30: 200 mg via INTRAVENOUS

## 2015-06-30 MED ORDER — HYDROMORPHONE HCL 1 MG/ML IJ SOLN
INTRAMUSCULAR | Status: AC
  Filled 2015-06-30: qty 1

## 2015-06-30 MED ORDER — STERILE WATER FOR IRRIGATION IR SOLN
Status: DC | PRN
  Administered 2015-06-30: 1000 mL via INTRAVESICAL

## 2015-06-30 MED ORDER — VASOPRESSIN 20 UNIT/ML IV SOLN
INTRAVENOUS | Status: AC
  Filled 2015-06-30: qty 1

## 2015-06-30 MED ORDER — SODIUM CHLORIDE 0.9 % IJ SOLN
INTRAMUSCULAR | Status: AC
  Filled 2015-06-30: qty 50

## 2015-06-30 MED ORDER — CEFAZOLIN SODIUM-DEXTROSE 2-3 GM-% IV SOLR
2.0000 g | INTRAVENOUS | Status: AC
  Administered 2015-06-30: 2 g via INTRAVENOUS

## 2015-06-30 MED ORDER — OXYCODONE-ACETAMINOPHEN 5-325 MG PO TABS
1.0000 | ORAL_TABLET | ORAL | Status: DC | PRN
  Administered 2015-07-01: 2 via ORAL
  Filled 2015-06-30: qty 2

## 2015-06-30 MED ORDER — FENTANYL CITRATE (PF) 250 MCG/5ML IJ SOLN
INTRAMUSCULAR | Status: AC
Start: 2015-06-30 — End: 2015-06-30
  Filled 2015-06-30: qty 25

## 2015-06-30 MED ORDER — LACTATED RINGERS IR SOLN
Status: DC | PRN
  Administered 2015-06-30: 3000 mL

## 2015-06-30 MED ORDER — ONDANSETRON HCL 4 MG/2ML IJ SOLN
INTRAMUSCULAR | Status: DC | PRN
  Administered 2015-06-30: 4 mg via INTRAVENOUS

## 2015-06-30 MED ORDER — NEOSTIGMINE METHYLSULFATE 10 MG/10ML IV SOLN
INTRAVENOUS | Status: DC | PRN
  Administered 2015-06-30: 4 mg via INTRAVENOUS

## 2015-06-30 MED ORDER — HYDROMORPHONE HCL 1 MG/ML IJ SOLN
INTRAMUSCULAR | Status: DC | PRN
  Administered 2015-06-30: 1 mg via INTRAVENOUS

## 2015-06-30 MED ORDER — SODIUM CHLORIDE 0.9 % IJ SOLN
INTRAMUSCULAR | Status: AC
  Filled 2015-06-30: qty 10

## 2015-06-30 MED ORDER — DEXAMETHASONE SODIUM PHOSPHATE 10 MG/ML IJ SOLN
INTRAMUSCULAR | Status: DC | PRN
  Administered 2015-06-30: 4 mg via INTRAVENOUS

## 2015-06-30 MED ORDER — METOPROLOL SUCCINATE ER 100 MG PO TB24
100.0000 mg | ORAL_TABLET | Freq: Every day | ORAL | Status: DC
  Administered 2015-07-01: 100 mg via ORAL
  Filled 2015-06-30 (×2): qty 1

## 2015-06-30 MED ORDER — GLYCOPYRROLATE 0.2 MG/ML IJ SOLN
INTRAMUSCULAR | Status: DC | PRN
  Administered 2015-06-30: 0.6 mg via INTRAVENOUS

## 2015-06-30 MED ORDER — ONDANSETRON HCL 4 MG/2ML IJ SOLN: 4.0000 mg | Freq: Once | INTRAMUSCULAR | Status: DC | PRN

## 2015-06-30 MED ORDER — DEXAMETHASONE SODIUM PHOSPHATE 4 MG/ML IJ SOLN
INTRAMUSCULAR | Status: AC
  Filled 2015-06-30: qty 1

## 2015-06-30 MED ORDER — ROPIVACAINE HCL 5 MG/ML IJ SOLN
INTRAMUSCULAR | Status: AC
  Filled 2015-06-30: qty 30

## 2015-06-30 MED ORDER — IBUPROFEN 600 MG PO TABS: 600.0000 mg | ORAL_TABLET | Freq: Four times a day (QID) | ORAL | Status: DC | PRN

## 2015-06-30 MED ORDER — CEFAZOLIN SODIUM-DEXTROSE 2-3 GM-% IV SOLR
INTRAVENOUS | Status: AC
  Filled 2015-06-30: qty 50

## 2015-06-30 MED ORDER — SCOPOLAMINE 1 MG/3DAYS TD PT72
1.0000 | MEDICATED_PATCH | Freq: Once | TRANSDERMAL | Status: DC
  Administered 2015-06-30: 1.5 mg via TRANSDERMAL

## 2015-06-30 SURGICAL SUPPLY — 56 items
BARRIER ADHS 3X4 INTERCEED (GAUZE/BANDAGES/DRESSINGS) ×3 IMPLANT
CATH FOLEY 3WAY  5CC 16FR (CATHETERS) ×2
CATH FOLEY 3WAY 5CC 16FR (CATHETERS) ×1 IMPLANT
CLOTH BEACON ORANGE TIMEOUT ST (SAFETY) ×3 IMPLANT
CONT PATH 16OZ SNAP LID 3702 (MISCELLANEOUS) ×3 IMPLANT
COVER BACK TABLE 60X90IN (DRAPES) ×6 IMPLANT
COVER TIP SHEARS 8 DVNC (MISCELLANEOUS) ×1 IMPLANT
COVER TIP SHEARS 8MM DA VINCI (MISCELLANEOUS) ×2
DECANTER SPIKE VIAL GLASS SM (MISCELLANEOUS) ×3 IMPLANT
DRAPE WARM FLUID 44X44 (DRAPE) ×3 IMPLANT
DRSG OPSITE POSTOP 3X4 (GAUZE/BANDAGES/DRESSINGS) ×3 IMPLANT
DURAPREP 26ML APPLICATOR (WOUND CARE) ×3 IMPLANT
ELECT REM PT RETURN 9FT ADLT (ELECTROSURGICAL) ×3
ELECTRODE REM PT RTRN 9FT ADLT (ELECTROSURGICAL) ×1 IMPLANT
GAUZE VASELINE 3X9 (GAUZE/BANDAGES/DRESSINGS) IMPLANT
GLOVE BIO SURGEON STRL SZ 6.5 (GLOVE) ×2 IMPLANT
GLOVE BIO SURGEON STRL SZ7 (GLOVE) ×3 IMPLANT
GLOVE BIO SURGEONS STRL SZ 6.5 (GLOVE) ×1
GLOVE BIOGEL PI IND STRL 7.0 (GLOVE) ×3 IMPLANT
GLOVE BIOGEL PI INDICATOR 7.0 (GLOVE) ×6
IV STOPCOCK 4 WAY 40  W/Y SET (IV SOLUTION)
IV STOPCOCK 4 WAY 40 W/Y SET (IV SOLUTION) IMPLANT
KIT ACCESSORY DA VINCI DISP (KITS) ×2
KIT ACCESSORY DVNC DISP (KITS) ×1 IMPLANT
LEGGING LITHOTOMY PAIR STRL (DRAPES) ×3 IMPLANT
LIQUID BAND (GAUZE/BANDAGES/DRESSINGS) ×3 IMPLANT
OCCLUDER COLPOPNEUMO (BALLOONS) ×3 IMPLANT
PACK ROBOT WH (CUSTOM PROCEDURE TRAY) ×3 IMPLANT
PACK ROBOTIC GOWN (GOWN DISPOSABLE) ×3 IMPLANT
PAD POSITIONING PINK XL (MISCELLANEOUS) ×3 IMPLANT
PAD PREP 24X48 CUFFED NSTRL (MISCELLANEOUS) ×6 IMPLANT
SET CYSTO W/LG BORE CLAMP LF (SET/KITS/TRAYS/PACK) ×3 IMPLANT
SET IRRIG TUBING LAPAROSCOPIC (IRRIGATION / IRRIGATOR) ×6 IMPLANT
SET TRI-LUMEN FLTR TB AIRSEAL (TUBING) ×3 IMPLANT
SUT VIC AB 0 CT1 27 (SUTURE) ×4
SUT VIC AB 0 CT1 27XBRD ANBCTR (SUTURE) ×2 IMPLANT
SUT VIC AB 4-0 PS2 27 (SUTURE) ×6 IMPLANT
SUT VICRYL 0 UR6 27IN ABS (SUTURE) ×6 IMPLANT
SUT VLOC 180 0 9IN  GS21 (SUTURE) ×4
SUT VLOC 180 0 9IN GS21 (SUTURE) ×2 IMPLANT
SYR 50ML LL SCALE MARK (SYRINGE) ×3 IMPLANT
SYSTEM CONVERTIBLE TROCAR (TROCAR) ×3 IMPLANT
TIP RUMI ORANGE 6.7MMX12CM (TIP) IMPLANT
TIP UTERINE 5.1X6CM LAV DISP (MISCELLANEOUS) IMPLANT
TIP UTERINE 6.7X10CM GRN DISP (MISCELLANEOUS) ×3 IMPLANT
TIP UTERINE 6.7X6CM WHT DISP (MISCELLANEOUS) IMPLANT
TIP UTERINE 6.7X8CM BLUE DISP (MISCELLANEOUS) ×3 IMPLANT
TOWEL OR 17X24 6PK STRL BLUE (TOWEL DISPOSABLE) ×9 IMPLANT
TROCAR 12M 150ML BLUNT (TROCAR) IMPLANT
TROCAR DISP BLADELESS 8 DVNC (TROCAR) ×1 IMPLANT
TROCAR DISP BLADELESS 8MM (TROCAR) ×2
TROCAR PORT AIRSEAL 5X120 (TROCAR) ×3 IMPLANT
TROCAR XCEL 12X100 BLDLESS (ENDOMECHANICALS) ×3 IMPLANT
TROCAR XCEL NON-BLD 5MMX100MML (ENDOMECHANICALS) ×3 IMPLANT
WARMER LAPAROSCOPE (MISCELLANEOUS) ×3 IMPLANT
WATER STERILE IRR 1000ML POUR (IV SOLUTION) ×9 IMPLANT

## 2015-06-30 NOTE — Anesthesia Postprocedure Evaluation (Signed)
  Anesthesia Post-op Note  Patient: [name] [name]  Procedure(s) Performed: Procedure(s) (LRB): ROBOTIC ASSISTED TOTAL HYSTERECTOMY WITH BILATERAL SALPINGECTOMY (Bilateral)  Patient Location: PACU  Anesthesia Type: General  Level of Consciousness: awake and alert   Airway and Oxygen Therapy: Patient Spontanous Breathing  Post-op Pain: mild  Post-op Assessment: Post-op Vital signs reviewed, Patient's Cardiovascular Status Stable, Respiratory Function Stable, Patent Airway and No signs of Nausea or vomiting  Last Vitals:  Filed Vitals:   06/30/15 1801  BP: 127/71  Pulse: 107  Temp: 36.8 C  Resp: 15    Post-op Vital Signs: stable   Complications: No apparent anesthesia complications

## 2015-06-30 NOTE — H&P (Signed)
[name] [name] is an 39 y.o. female [id number]  RP:  Menorrhagia/Pelvic pain/Uterine myomas for Robotic TLH/Bilat. Salpingectomy  Pertinent Gynecological History: Menses: flow is excessive with use of many pads or tampons on heaviest days Contraception: BT/S Blood transfusions: none Sexually transmitted diseases: no past history Last pap: normal  OB History: G3P3003   Menstrual History:  No LMP recorded.    Past Medical History  Diagnosis Date  . Obesity 01/02/2014  . Asthma, chronic 01/02/2014  . Essential hypertension, benign 01/02/2014  . Insomnia   . Anxiety     Past Surgical History  Procedure Laterality Date  . Gallbladder removed  2001  . Breast reduction surgery  2001  . Lipo suction  2012  . Tubal ligation  2010  . Cholecystectomy      Family History  Problem Relation Age of Onset  . Hypertension Mother   . Hypertension Maternal Grandmother   . Other Sister     Deceaesd, 1 from blood clot  . Bipolar disorder Sister   . Healthy Son     x2  . Healthy Daughter     x1    Social History:  reports that she has never smoked. She does not have any smokeless tobacco history on file. She reports that she does not drink alcohol or use illicit drugs.  Allergies: No Known Allergies  Prescriptions prior to admission  Medication Sig Dispense Refill Last Dose  . metoprolol succinate (TOPROL-XL) 100 MG 24 hr tablet Take 1 tablet (100 mg total) by mouth daily. Take with or immediately following a meal. 90 tablet 1 06/30/2015 at 1000  . zolpidem (AMBIEN) 10 MG tablet Take 1 tablet (10 mg total) by mouth at bedtime as needed for sleep. 30 tablet 2 06/29/2015 at Unknown time    ROS  Blood pressure 145/84, pulse 72, temperature 98.4 F (36.9 C), temperature source Oral, resp. rate 20, SpO2 100 %. Physical Exam  Results for orders placed or performed during the hospital encounter of 06/30/15 (from the past 24 hour(s))  Pregnancy, urine     Status: None   Collection  Time: 06/30/15 11:15 AM  Result Value Ref Range   Preg Test, Ur NEGATIVE NEGATIVE    Assessment/Plan: Menorrhagia/pelvic pain/uterine myomas for Robotic TLH/Bilateral Salpingectomy.  Surgery/risks reviewed.  Sutton Plake,MARIE-LYNE 06/30/2015, 1:10 PM

## 2015-06-30 NOTE — Transfer of Care (Signed)
Immediate Anesthesia Transfer of Care Note  Patient: [name] [name]  Procedure(s) Performed: Procedure(s) with comments: ROBOTIC ASSISTED TOTAL HYSTERECTOMY WITH BILATERAL SALPINGECTOMY (Bilateral) - 2 1/2 hrs.  Patient Location: PACU  Anesthesia Type:General  Level of Consciousness: awake, alert  and oriented  Airway & Oxygen Therapy: Patient Spontanous Breathing and Patient connected to nasal cannula oxygen  Post-op Assessment: Report given to RN and Post -op Vital signs reviewed and stable  Post vital signs: Reviewed and stable  Last Vitals:  Filed Vitals:   06/30/15 1118  BP: 145/84  Pulse: 72  Temp: 36.9 C  Resp: 20    Complications: No apparent anesthesia complications

## 2015-06-30 NOTE — Anesthesia Procedure Notes (Signed)
Procedure Name: Intubation Date/Time: 06/30/2015 1:30 PM Performed by: Bufford Spikes Pre-anesthesia Checklist: Patient identified, Timeout performed, Emergency Drugs available, Suction available and Patient being monitored Patient Re-evaluated:Patient Re-evaluated prior to inductionOxygen Delivery Method: Circle system utilized Preoxygenation: Pre-oxygenation with 100% oxygen Intubation Type: IV induction Ventilation: Mask ventilation without difficulty Laryngoscope Size: Miller and 2 Grade View: Grade I Tube type: Oral Tube size: 7.0 mm Number of attempts: 1 Airway Equipment and Method: Stylet Placement Confirmation: ETT inserted through vocal cords under direct vision,  breath sounds checked- equal and bilateral and positive ETCO2 Secured at: 20 cm Tube secured with: Tape Dental Injury: Teeth and Oropharynx as per pre-operative assessment

## 2015-06-30 NOTE — Anesthesia Preprocedure Evaluation (Addendum)
Anesthesia Evaluation  Patient identified by MRN, date of birth, ID band Patient awake    Reviewed: Allergy & Precautions, NPO status , Patient's Chart, lab work & pertinent test results  History of Anesthesia Complications Negative for: history of anesthetic complications  Airway Mallampati: III  TM Distance: >3 FB Neck ROM: Full    Dental no notable dental hx. (+) Dental Advisory Given   Pulmonary asthma ,    Pulmonary exam normal breath sounds clear to auscultation       Cardiovascular hypertension, Pt. on medications and Pt. on home beta blockers Normal cardiovascular exam Rhythm:Regular Rate:Normal     Neuro/Psych PSYCHIATRIC DISORDERS Anxiety negative neurological ROS     GI/Hepatic negative GI ROS, Neg liver ROS,   Endo/Other  obesity  Renal/GU negative Renal ROS  negative genitourinary   Musculoskeletal negative musculoskeletal ROS (+)   Abdominal   Peds negative pediatric ROS (+)  Hematology negative hematology ROS (+)   Anesthesia Other Findings   Reproductive/Obstetrics negative OB ROS                            Anesthesia Physical Anesthesia Plan  ASA: II  Anesthesia Plan: General   Post-op Pain Management:    Induction: Intravenous  Airway Management Planned: Oral ETT  Additional Equipment:   Intra-op Plan:   Post-operative Plan: Extubation in OR  Informed Consent: I have reviewed the patients History and Physical, chart, labs and discussed the procedure including the risks, benefits and alternatives for the proposed anesthesia with the patient or authorized representative who has indicated his/her understanding and acceptance.   Dental advisory given  Plan Discussed with: CRNA  Anesthesia Plan Comments:         Anesthesia Quick Evaluation

## 2015-06-30 NOTE — Op Note (Signed)
06/30/2015  4:40 PM  PATIENT:  [name] [name]  39 y.o. female  PRE-OPERATIVE DIAGNOSIS:  Uterine Myomas, Menorrhagia, Pelvic Pain  POST-OPERATIVE DIAGNOSIS:  uterine myomas,Menorrhagia,Pelvic pain  PROCEDURE:  Procedure(s): ROBOTIC ASSISTED TOTAL HYSTERECTOMY WITH BILATERAL SALPINGECTOMY  SURGEON:  Surgeon(s): Princess Bruins, MD  ASSISTANTS: Hester Mates   ANESTHESIA:   general   PROCEDURE:  Under general anesthesia with endotracheal intubation the patient is in lithotomy position. She is prepped with Chloro prep on the abdomen and with Betadine on the suprapubic, vulvar and vaginal areas.  She is draped as usual.  The Foley was introduced in the bladder.  A medium Koh ring with a #10 Rumi are put in place at the uterus and cervix.  The tenaculum and weighted speculum are removed.  Abdominally we infiltrate the supraumbilical area with Marcaine one quarter plain 10 cc. We make a 1.5 cm incision at that level with the scalpel. The aponeurosis is opened under direct vision with Mayo scissors.  The parietal peritoneum is opened as well with Mayo scissors. We put in place a pursestring stitch of Vicryl 0 at the aponeurosis.  The Sheryle Hail is inserted at that level under direct vision and up pneumoperitoneum is created with CO2.  The camera is inserted in that port. The anterior wall of the abdomen is free of any adhesion. The uterus presents a large intramural anterior myoma measuring about 7 cm. Both tubes presents a previous tubal sterilization.  Both ovaries are normal in size and appearance.  We use a semicircular configuration for port placement with 2 robotic ports on the right and 1 robotic ports on the lower left. The assistant port is placed at the upper left.  All sites are infiltrated with Marcaine one quarter plain and a small incision is done with the scalpel. All ports are inserted under direct vision. We then docked the robot from the right side and put the robotic instruments in  under direct vision.  The EndoShears scissor is inserted in the first arm, the PK in the second and the fenestrated clamp in the third.  We go to the console.      Both ureters are well visualized and normal anatomic position.  We start on the right side with cauterization and section of the right mesosalpinx. We then cauterized and sectioned the right utero-ovarian ligament and then cauterized and sectioned the right round ligament. We opened the visceral peritoneum anteriorly and start descending the bladder.  We proceeded exactly the same way on the left side.  The bladder was descended further, passed the Koe ring.  The bladder was filled with saline to assure that it was descended low enough and avoid bladder trauma as we were doing so.  We then cauterized in sectioned the right uterine artery.  We then cauterized and sectioned the left uterine artery.  The occluder was filled up in the vagina.  We opened the anterior aspect of the upper vagina on top of the Koe ring with the tip of the Endo Shears scissors, we then continued opening the upper vagina on the right side then the left side and finished posteriorly.  The uterus with the cervix and both tubes was completely detached and passed vaginally.  It was sent to pathology.  The occluder was put in place and the vagina.  We then controlled hemostasis at the vaginal vault with the PK.  The instruments were changed to the cutting needle driver in the first arm, the long tip in the  second arm and the PK in the third arm.  We used a V lock 0 at 9 inches to close the vaginal vault in a running suture starting at the right angle.  Given the thick vaginal vault, we stopped to third of the way and did a few sutures back towards the right angle.  We then used a second V lock 0 at 9 inches starting at the left angle in a running suture.  When the vagina was completely closed in a full-thickness we continued with that stitch with just the upper aspect of the vaginal  vault and then came back in our steps.  We parked both needles in the left abdominal wall.  We irrigated and suctioned with the [place] and confirmed adequate hemostasis at all levels.  Good peristalsis was seen at both ureters and the uterine was clear.  We removed all robotic instruments.  The robot was undocked.  We went to laparoscopy time.      We used the 8 mm camera and removed both needles through the [place] port.  We removed the Trendelenburg and irrigated and suctioned as much fluid as possible.  Hemostasis was confirmed once more.  We then poured Bupivacaine in the abdominal cavity.  All laparoscopic instruments were removed.  All ports were removed under direct vision. The CO2 was evacuated.  He pursestring stitch was attached at the supraumbilical incision.  The Bovie was used to complete hemostasis where necessary at the skin incisions.  All incisions were closed with a subcuticular suture of Vicryl 4-0. We added Dermabond on all incisions. A honeycomb dressing was applied at the supraumbilical incision. The occluder was removed from the vagina and good hemostasis at that level was confirmed. The patient was brought to recovery room in good and stable status.  ESTIMATED BLOOD LOSS: 150 cc   Intake/Output Summary (Last 24 hours) at 06/30/15 1640 Last data filed at 06/30/15 1630  Gross per 24 hour  Intake   1000 ml  Output    650 ml  Net    350 ml     BLOOD ADMINISTERED:none   LOCAL MEDICATIONS USED:  MARCAINE    and BUPIVICAINE   SPECIMEN:  Source of Specimen:  Uterus with cervix and bilateral tubes  DISPOSITION OF SPECIMEN:  PATHOLOGY  COUNTS:  YES  PLAN OF CARE: Transfer to PACU  Princess Bruins MD  06/30/2015 at 4:40 pm

## 2015-07-01 LAB — CBC
HCT: 35.5 % — ABNORMAL LOW (ref 36.0–46.0)
HEMOGLOBIN: 11.6 g/dL — AB (ref 12.0–15.0)
MCH: 27.9 pg (ref 26.0–34.0)
MCHC: 32.7 g/dL (ref 30.0–36.0)
MCV: 85.3 fL (ref 78.0–100.0)
Platelets: 256 10*3/uL (ref 150–400)
RBC: 4.16 MIL/uL (ref 3.87–5.11)
RDW: 13 % (ref 11.5–15.5)
WBC: 14.7 10*3/uL — ABNORMAL HIGH (ref 4.0–10.5)

## 2015-07-01 MED ORDER — OXYCODONE-ACETAMINOPHEN 7.5-325 MG PO TABS: 1.0000 | ORAL_TABLET | ORAL | Status: DC | PRN

## 2015-07-01 NOTE — Discharge Summary (Signed)
  Physician Discharge Summary  Patient ID: [name] [name] MRN: [id number] DOB/AGE: 1976-06-25 39 y.o.  Admit date: 06/30/2015 Discharge date: 07/01/2015  Admission Diagnoses: Uterine Myomas, Menorrhagia, Pelvic Pain  Discharge Diagnoses: Uterine Myomas, Menorrhagia, Pelvic Pain        Active Problems:   Postoperative state   Discharged Condition: good  Hospital Course: good  Consults: None  Treatments: surgery: Robotic total laparoscopic hysterectomy with bilateral salpingectomy  Disposition: ED Dismiss - Never Arrived     Medication List    TAKE these medications        metoprolol succinate 100 MG 24 hr tablet  Commonly known as:  TOPROL-XL  Take 1 tablet (100 mg total) by mouth daily. Take with or immediately following a meal.     oxyCODONE-acetaminophen 7.5-325 MG per tablet  Commonly known as:  PERCOCET  Take 1 tablet by mouth every 4 (four) hours as needed for severe pain.     zolpidem 10 MG tablet  Commonly known as:  AMBIEN  Take 1 tablet (10 mg total) by mouth at bedtime as needed for sleep.           Follow-up Information    Follow up with LAVOIE,MARIE-LYNE, MD In 3 weeks.   Specialty:  Obstetrics and Gynecology   Contact information:   [address] [address] [phone]       Signed: Princess Bruins, MD 07/01/2015, 1:05 PM

## 2015-07-01 NOTE — Anesthesia Postprocedure Evaluation (Signed)
  Anesthesia Post-op Note  Patient: [name] [name]  Procedure(s) Performed: Procedure(s) with comments: ROBOTIC ASSISTED TOTAL HYSTERECTOMY WITH BILATERAL SALPINGECTOMY (Bilateral) - 2 1/2 hrs.  Patient Location: PACU and Women[facility] Unit  Anesthesia Type:General  Level of Consciousness: awake, alert  and oriented  Airway and Oxygen Therapy: Patient Spontanous Breathing  Post-op Pain: mild  Post-op Assessment: Patient's Cardiovascular Status Stable, Respiratory Function Stable, No signs of Nausea or vomiting, Adequate PO intake, Pain level controlled, No headache, No backache and Patient able to bend at knees              Post-op Vital Signs: Reviewed and stable  Last Vitals:  Filed Vitals:   07/01/15 1000  BP: 121/57  Pulse: 82  Temp: 36.9 C  Resp: 18    Complications: No apparent anesthesia complications

## 2015-07-01 NOTE — Discharge Instructions (Signed)
Total Laparoscopic Hysterectomy, Care After °Refer to this sheet in the next few weeks. These instructions provide you with information on caring for yourself after your procedure. Your health care provider may also give you more specific instructions. Your treatment has been planned according to current medical practices, but problems sometimes occur. Call your health care provider if you have any problems or questions after your procedure. °WHAT TO EXPECT AFTER THE PROCEDURE °· Pain and bruising at the incision sites. You will be given pain medicine to control it. °· Menopausal symptoms such as hot flashes, night sweats, and insomnia if your ovaries were removed. °· Sore throat from the breathing tube that was inserted during surgery. °HOME CARE INSTRUCTIONS °· Only take over-the-counter or prescription medicines for pain, discomfort, or fever as directed by your health care provider.   °· Do not take aspirin. It can cause bleeding.   °· Do not drive when taking pain medicine.   °· Follow your health care provider's advice regarding diet, exercise, lifting, driving, and general activities.   °· Resume your usual diet as directed and allowed.   °· Get plenty of rest and sleep.   °· Do not douche, use tampons, or have sexual intercourse for at least 6 weeks, or until your health care provider gives you permission.   °· Change your bandages (dressings) as directed by your health care provider.   °· Monitor your temperature and notify your health care provider of a fever.   °· Take showers instead of baths for 2-3 weeks.   °· Do not drink alcohol until your health care provider gives you permission.   °· If you develop constipation, you may take a mild laxative with your health care provider's permission. Bran foods may help with constipation problems. Drinking enough fluids to keep your urine clear or pale yellow may help as well.   °· Try to have someone home with you for 1-2 weeks to help around the house.    °· Keep all of your follow-up appointments as directed by your health care provider.   °SEEK MEDICAL CARE IF: °· You have swelling, redness, or increasing pain around your incision sites.   °· You have pus coming from your incision.   °· You notice a bad smell coming from your incision.   °· Your incision breaks open.   °· You feel dizzy or lightheaded.   °· You have pain or bleeding when you urinate.   °· You have persistent diarrhea.   °· You have persistent nausea and vomiting.   °· You have abnormal vaginal discharge.   °· You have a rash.   °· You have any type of abnormal reaction or develop an allergy to your medicine.   °· You have poor pain control with your prescribed medicine.   °SEEK IMMEDIATE MEDICAL CARE IF: °· You have chest pain or shortness of breath. °· You have severe abdominal pain that is not relieved with pain medicine. °· You have pain or swelling in your legs. °MAKE SURE YOU: °· Understand these instructions. °· Will watch your condition. °· Will get help right away if you are not doing well or get worse. °Document Released: 07/18/2013 Document Revised: 10/02/2013 Document Reviewed: 07/18/2013 °[employer]® Patient Information ©2015 [employer]. This information is not intended to replace advice given to you by your health care provider. Make sure you discuss any questions you have with your health care provider. ° °

## 2015-07-01 NOTE — Progress Notes (Signed)
Pt. Is discharged in the care of husband, with N.T. Escort. Denies pain or discomort. Spirits are good. Abdominal lapsites are clean and dry. Understands all instructions well Questions asked and answered. No equipment needed for home used.

## 2015-07-01 NOTE — Progress Notes (Signed)
1 Day Post-Op Procedure(s) (LRB): ROBOTIC ASSISTED TOTAL HYSTERECTOMY WITH BILATERAL SALPINGECTOMY (Bilateral)  Subjective: Patient reports that pain is well managed.  Tolerating normal diet as tolerated  diet without difficulty. No nausea / vomiting.  Ambulating and voiding.  Objective: BP 121/57 mmHg  Pulse 82  Temp(Src) 98.4 F (36.9 C) (Oral)  Resp 18  Ht 5\' 2"  (1.575 m)  Wt 190 lb (86.183 kg)  BMI 34.74 kg/m2  SpO2 98% Lungs: clear Heart: normal rate and rhythm Abdomen:soft and appropriately tender Extremities: Homans sign is negative, no sign of DVT Incision: healing well  Results for [name], [name] (MRN [id number]) as of 07/01/2015 13:02  Ref. Range 07/01/2015 06:25  WBC Latest Ref Range: 4.0-10.5 K/uL 14.7 (H)  RBC Latest Ref Range: 3.87-5.11 MIL/uL 4.16  Hemoglobin Latest Ref Range: 12.0-15.0 g/dL 11.6 (L)  HCT Latest Ref Range: 36.0-46.0 % 35.5 (L)  MCV Latest Ref Range: 78.0-100.0 fL 85.3  MCH Latest Ref Range: 26.0-34.0 pg 27.9  MCHC Latest Ref Range: 30.0-36.0 g/dL 32.7  RDW Latest Ref Range: 11.5-15.5 % 13.0  Platelets Latest Ref Range: 150-400 K/uL 256    Assessment: s/p Procedure(s): ROBOTIC ASSISTED TOTAL HYSTERECTOMY WITH BILATERAL SALPINGECTOMY: progressing well  Plan: Discharge home  LOS: 1 day    [name],[name] 07/01/2015, 1:02 PM

## 2015-07-01 NOTE — Addendum Note (Signed)
Addendum  created 07/01/15 1042 by Elenore Paddy, CRNA   Modules edited: Notes Section   Notes Section:  File: [id number]

## 2015-07-17 ENCOUNTER — Ambulatory Visit (INDEPENDENT_AMBULATORY_CARE_PROVIDER_SITE_OTHER): Payer: 59 | Admitting: Osteopathic Medicine

## 2015-07-17 ENCOUNTER — Encounter: Payer: Self-pay | Admitting: Osteopathic Medicine

## 2015-07-17 ENCOUNTER — Ambulatory Visit (INDEPENDENT_AMBULATORY_CARE_PROVIDER_SITE_OTHER): Payer: 59

## 2015-07-17 VITALS — BP 132/87 | HR 107 | Temp 99.6°F | Wt 194.0 lb

## 2015-07-17 DIAGNOSIS — R509 Fever, unspecified: Secondary | ICD-10-CM

## 2015-07-17 DIAGNOSIS — R0989 Other specified symptoms and signs involving the circulatory and respiratory systems: Secondary | ICD-10-CM

## 2015-07-17 DIAGNOSIS — R05 Cough: Secondary | ICD-10-CM

## 2015-07-17 DIAGNOSIS — J0120 Acute ethmoidal sinusitis, unspecified: Secondary | ICD-10-CM | POA: Diagnosis not present

## 2015-07-17 DIAGNOSIS — J209 Acute bronchitis, unspecified: Secondary | ICD-10-CM

## 2015-07-17 DIAGNOSIS — R059 Cough, unspecified: Secondary | ICD-10-CM

## 2015-07-17 MED ORDER — BENZONATATE 200 MG PO CAPS: 200.0000 mg | ORAL_CAPSULE | Freq: Three times a day (TID) | ORAL | Status: DC | PRN

## 2015-07-17 MED ORDER — GUAIFENESIN-CODEINE 100-10 MG/5ML PO SYRP: 5.0000 mL | ORAL_SOLUTION | Freq: Three times a day (TID) | ORAL | Status: DC | PRN

## 2015-07-17 MED ORDER — LEVOFLOXACIN 500 MG PO TABS: 500.0000 mg | ORAL_TABLET | Freq: Every day | ORAL | Status: DC

## 2015-07-17 NOTE — Progress Notes (Signed)
HPI: [name] [name] is a 39 y.o. female who presents to [facility]  today for chief complaint of:  Chief Complaint  Patient presents with  . URI    2-3 weeks   Sore throat and chest congestion . Location: throat, chest . Quality: congestions, pressure, coughing . Severity: mild to moderate . Duration: 2 -3 weeks . Timing: bothering her since surgery (vag/laparoscopic hysterectomy) 06/2015 . Modifying factors: Taking OTC cold/flu, tylenol last dose was few days ago, last  . Assoc signs/symptoms: fever over a week 100.2. Hurts to take deep breath. No tobacco use or secondhand smoke exposure.    Past medical, social and family history reviewed: Past Medical History  Diagnosis Date  . Obesity 01/02/2014  . Asthma, chronic 01/02/2014  . Essential hypertension, benign 01/02/2014  . Insomnia   . Anxiety    Past Surgical History  Procedure Laterality Date  . Gallbladder removed  2001  . Breast reduction surgery  2001  . Lipo suction  2012  . Tubal ligation  2010  . Cholecystectomy     Social History  Substance Use Topics  . Smoking status: Never Smoker   . Smokeless tobacco: Not on file  . Alcohol Use: No   Family History  Problem Relation Age of Onset  . Hypertension Mother   . Hypertension Maternal Grandmother   . Other Sister     Deceaesd, 53 from blood clot  . Bipolar disorder Sister   . Healthy Son     x2  . Healthy Daughter     x1    Current Outpatient Prescriptions  Medication Sig Dispense Refill  . metoprolol succinate (TOPROL-XL) 100 MG 24 hr tablet Take 1 tablet (100 mg total) by mouth daily. Take with or immediately following a meal. 90 tablet 1  . oxyCODONE-acetaminophen (PERCOCET) 7.5-325 MG per tablet Take 1 tablet by mouth every 4 (four) hours as needed for severe pain. 30 tablet 0  . zolpidem (AMBIEN) 10 MG tablet Take 1 tablet (10 mg total) by mouth at bedtime as needed for sleep. 30 tablet 2   No current  facility-administered medications for this visit.   No Known Allergies    Review of Systems: CONSTITUTIONAL: Neg fever/chills, no unintentional weight changes HEAD/EYES/EARS/NOSE/THROAT: No headache/vision change or hearing change, no sore throat CARDIAC: No chest pain/pressure/palpitations, no orthopnea RESPIRATORY: No cough/shortness of breath/wheeze GASTROINTESTINAL: No nausea/vomiting/abdominal pain/blood in stool/diarrhea/constipation MUSCULOSKELETAL: No myalgia/arthralgia GENITOURINARY: No incontinence, No abnormal genital bleeding/discharge SKIN: No rash/wounds/concerning lesions HEM/ONC: No easy bruising/bleeding, no abnormal lymph node ENDOCRINE: No polyuria/polydipsia/polyphagia, no heat/cold intolerance  NEUROLOGIC: No weakness/dizzines/slurred speech PSYCHIATRIC: No concerns with depression/anxiety or sleep problems    Exam:  BP 132/87 mmHg  Pulse 107  Temp(Src) 99.6 F (37.6 C) (Oral)  Wt 194 lb (87.998 kg)  SpO2 98%  LMP 05/30/2015 Constitutional: VSS, see above. General Appearance: alert, well-developed, well-nourished, NAD Eyes: Normal lids and conjunctive, non-icteric sclera, PERRLA Ears, Nose, Mouth, Throat: Normal external inspection ears/nares/mouth/lips/gums, Normal TM bilaterally, MMM, posterior pharynx without erythema/exudate Neck: No masses, trachea midline. No thyroid enlargement/tenderness/mass appreciated Respiratory: Normal respiratory effort. no wheeze/rhonchi/rales Cardiovascular: S1/S2 normal, no murmur/rub/gallop auscultated. RRR. No carotid bruit or JVD. No abdominal aortic bruit. Pedal pulse II/IV bilaterally DP and PT. No lower extremity edema. Gastrointestinal: Nontender, no masses. No hepatomegaly, no splenomegaly. No hernia appreciated. Bowel sounds normal. Rectal exam deferred.  Musculoskeletal: Gait normal. No clubbing/cyanosis of digits.  Neurological: No cranial nerve deficit on limited exam. Motor and sensation  intact and  symmetric Psychiatric: Normal judgment/insight. Normal mood and affect. Oriented x3.    No results found for this or any previous visit (from the past 72 hour(s)).  Labs reviewed from OBGYN: 07/16/15 WBC 11.9 Hgb 11.3 Hct 34.2 Plt 385   ASSESSMENT/PLAN:  Cough - Plan: DG Chest 2 View, benzonatate (TESSALON) 200 MG capsule, guaiFENesin-codeine (ROBITUSSIN AC) 100-10 MG/5ML syrup  Fever and chills - Plan: DG Chest 2 View  Acute ethmoidal sinusitis, recurrence not specified - Plan: levofloxacin (LEVAQUIN) 500 MG tablet  Acute bronchitis, unspecified organism   Low suspicion for postop problem, recently seen by GYN and no problems with incisions/vaginal discharge, no urinary symptoms. Likely viral URI but consideration for bacterial sinusitis. Cough/laryngeal irritation also a problem most likely due to intubation, may need ENT referral for further evaluation if sore throat persists. Advised don't take Codeine syrup with pain meds or ambien. RTC if no better after abx, sooner if any other concerns or if feeling worse.

## 2015-09-01 ENCOUNTER — Other Ambulatory Visit: Payer: Self-pay | Admitting: Family Medicine

## 2015-09-30 ENCOUNTER — Other Ambulatory Visit: Payer: Self-pay | Admitting: Family Medicine

## 2015-10-01 ENCOUNTER — Ambulatory Visit (INDEPENDENT_AMBULATORY_CARE_PROVIDER_SITE_OTHER): Payer: 59 | Admitting: Family Medicine

## 2015-10-01 ENCOUNTER — Encounter: Payer: Self-pay | Admitting: Family Medicine

## 2015-10-01 VITALS — BP 128/84 | HR 67 | Wt 192.0 lb

## 2015-10-01 DIAGNOSIS — G4700 Insomnia, unspecified: Secondary | ICD-10-CM

## 2015-10-01 MED ORDER — ZOLPIDEM TARTRATE ER 12.5 MG PO TBCR: 12.5000 mg | EXTENDED_RELEASE_TABLET | Freq: Every evening | ORAL | Status: DC | PRN

## 2015-10-01 NOTE — Progress Notes (Signed)
CC: [name] [name] is a 39 y.o. female is here for Medication Refill   Subjective: HPI:   follow-up insomnia: for some reason she is noticing that 10 mg of Ambien is not fully effective at helping her fall asleep a few nights out of the month. Nothing seems to make symptoms better or worse other than  Ambien overall  Being helpful on most days out of the month.  Occasionally she'll try to sleep at night without Ambien to see if she still needs it and she'll have severe difficulty with falling asleep. She denies any difficulty with staying asleep. On the nights that she has difficulty falling asleep despite taking 10 mg of Ambien she'll take an additional 5 and fall asleep within 20 minutes. She denies any sleepwalking or abnormal nocturnal behavior. She denies any side effects. Denies pain, anxiety or urinary habits interfering with sleep.   Review Of Systems Outlined In HPI  Past Medical History  Diagnosis Date  . Obesity 01/02/2014  . Asthma, chronic 01/02/2014  . Essential hypertension, benign 01/02/2014  . Insomnia   . Anxiety     Past Surgical History  Procedure Laterality Date  . Gallbladder removed  2001  . Breast reduction surgery  2001  . Lipo suction  2012  . Tubal ligation  2010  . Cholecystectomy     Family History  Problem Relation Age of Onset  . Hypertension Mother   . Hypertension Maternal Grandmother   . Other Sister     Deceaesd, 93 from blood clot  . Bipolar disorder Sister   . Healthy Son     x2  . Healthy Daughter     x1    Social History   Social History  . Marital Status: Married    Spouse Name: N/A  . Number of Children: N/A  . Years of Education: N/A   Occupational History  . Not on file.   Social History Main Topics  . Smoking status: Never Smoker   . Smokeless tobacco: Not on file  . Alcohol Use: No  . Drug Use: No  . Sexual Activity:    Partners: Male   Other Topics Concern  . Not on file   Social History Narrative   She live  with husband and three children.   She works part-time at [employer] level of education:  Some college     Objective: BP 128/84 mmHg  Pulse 67  Wt 192 lb (87.091 kg)  Vital signs reviewed. General: Alert and Oriented, No Acute Distress HEENT: Pupils equal, round, reactive to light. Conjunctivae clear.  External ears unremarkable.  Moist mucous membranes. Lungs: Clear and comfortable work of breathing, speaking in full sentences without accessory muscle use. Cardiac: Regular rate and rhythm.  Neuro: CN II-XII grossly intact, gait normal. Extremities: No peripheral edema.  Strong peripheral pulses.  Mental Status: No depression, anxiety, nor agitation. Logical though process. Skin: Warm and dry.  Assessment & Plan: [name] [name] was seen today for medication refill.  Diagnoses and all orders for this visit:  Insomnia -     zolpidem (AMBIEN CR) 12.5 MG CR tablet; Take 1 tablet (12.5 mg total) by mouth at bedtime as needed for sleep.  Other orders -     Cancel: zolpidem (AMBIEN) 10 MG tablet; Take 1 tablet (10 mg total) by mouth at bedtime as needed. for sleep. PATIENT NEEDS APPOINTMENT FOR FURTHER REFILLS   Insomnia: Overall controlled however I'd like to see if she  gets a better response from the long-acting version of Ambien as opposed to the 10 mg immediate release due to some nights still having trouble falling asleep.  No Follow-up on file.

## 2015-10-09 ENCOUNTER — Telehealth: Payer: Self-pay

## 2015-10-09 NOTE — Telephone Encounter (Signed)
Pt was taken off of Ambien 10 mg on 10/01/15 and was put on Ambien 12.5 mg.  She reports that the new dose of medication is making her drowsy during the day. Do you have any other suggestions?

## 2015-10-10 NOTE — Telephone Encounter (Signed)
My suggestion would be to consider going back to the 10 mg Ambien or trying a separate drugs such as Lunesta. Not sure if she's ever tried that before.

## 2015-10-14 ENCOUNTER — Telehealth: Payer: Self-pay

## 2015-10-14 MED ORDER — ESZOPICLONE 2 MG PO TABS: 2.0000 mg | ORAL_TABLET | Freq: Every evening | ORAL | Status: DC | PRN

## 2015-10-14 NOTE — Telephone Encounter (Signed)
Ambien 12.5 mg is making pt drowsy during the day.  Any suggestions?

## 2015-10-14 NOTE — Telephone Encounter (Signed)
I would recommend switching to a different sleep medicine called Lunesta, Rx in your inbox, let me know if not effective by next week.

## 2015-10-14 NOTE — Telephone Encounter (Signed)
Awaiting call back.

## 2015-10-15 MED FILL — ESZOPICLONE 2 MG TABLET: 2 | 30 days supply | Qty: 30 | Fill #0

## 2015-10-15 NOTE — Telephone Encounter (Signed)
Rx faxed to [place]

## 2015-10-17 MED FILL — ESCITALOPRAM 20 MG TABLET: 20 | 30 days supply | Qty: 30 | Fill #3

## 2015-10-22 ENCOUNTER — Telehealth: Payer: Self-pay

## 2015-10-22 MED ORDER — ESZOPICLONE 3 MG PO TABS: 3.0000 mg | ORAL_TABLET | Freq: Every day | ORAL | Status: DC

## 2015-10-22 NOTE — Telephone Encounter (Signed)
Pt reports that Lunesta isn't helping her sleep.  She is asking that the dose be higher or could be on something else?

## 2015-10-22 NOTE — Telephone Encounter (Signed)
[place], Rx placed in in-box ready for pickup/faxing. Yes an increase to 3mg  of lunesta would be the highest dose, if this does not work by next week please let me know and we'll switch to a different medication all together.

## 2015-10-22 NOTE — Telephone Encounter (Signed)
Pt.notified

## 2015-11-05 MED FILL — ZOLPIDEM TART ER 12.5 MG TA: 12.5 | 30 days supply | Qty: 30 | Fill #1

## 2015-11-06 DIAGNOSIS — H524 Presbyopia: Secondary | ICD-10-CM | POA: Diagnosis not present

## 2015-11-06 DIAGNOSIS — H5203 Hypermetropia, bilateral: Secondary | ICD-10-CM | POA: Diagnosis not present

## 2015-11-06 DIAGNOSIS — H52223 Regular astigmatism, bilateral: Secondary | ICD-10-CM | POA: Diagnosis not present

## 2015-11-07 MED FILL — ESZOPICLONE 3 MG TABLET: 3 | 30 days supply | Qty: 30 | Fill #0

## 2015-12-04 MED FILL — ESCITALOPRAM 20 MG TABLET: 20 | 30 days supply | Qty: 30 | Fill #4

## 2015-12-04 MED FILL — ZOLPIDEM TART ER 12.5 MG TA: 12.5 | 30 days supply | Qty: 30 | Fill #2

## 2015-12-16 ENCOUNTER — Ambulatory Visit (INDEPENDENT_AMBULATORY_CARE_PROVIDER_SITE_OTHER): Payer: 59 | Admitting: Family Medicine

## 2015-12-16 ENCOUNTER — Encounter: Payer: Self-pay | Admitting: Family Medicine

## 2015-12-16 VITALS — BP 127/84 | HR 75 | Wt 199.0 lb

## 2015-12-16 DIAGNOSIS — L987 Excessive and redundant skin and subcutaneous tissue: Secondary | ICD-10-CM

## 2015-12-16 DIAGNOSIS — L739 Follicular disorder, unspecified: Secondary | ICD-10-CM

## 2015-12-16 MED ORDER — CLINDAMYCIN HCL 300 MG PO CAPS: 300.0000 mg | ORAL_CAPSULE | Freq: Three times a day (TID) | ORAL | Status: DC

## 2015-12-16 MED FILL — METOPROLOL SUCC ER 100 MG T: 100 | 90 days supply | Qty: 90 | Fill #1

## 2015-12-16 MED FILL — CLINDAMYCIN HCL 300 MG CAPS: 300 | 30 days supply | Qty: 30 | Fill #0

## 2015-12-16 NOTE — Progress Notes (Signed)
CC: [name] [name] is a 40 y.o. female is here for Recurrent Skin Infections   Subjective: HPI:  Complaints of pain that is mild severity and localized in the right axillary region. It's worse with her arm adducted to the torso. She also has difficult to completely adduct the humerus to the side of her torso. It's been worsening over the past 3 or 4 years. Nothing seems to make symptoms better or worse other than that described above. She's had multiple sebaceous cysts and boils that needed to be lanced from this region in the remote past. She denies any fevers, chills or skin changes other than some redness in the right axillary region.  Review Of Systems Outlined In HPI  Past Medical History  Diagnosis Date  . Obesity 01/02/2014  . Asthma, chronic 01/02/2014  . Essential hypertension, benign 01/02/2014  . Insomnia   . Anxiety     Past Surgical History  Procedure Laterality Date  . Gallbladder removed  2001  . Breast reduction surgery  2001  . Lipo suction  2012  . Tubal ligation  2010  . Cholecystectomy     Family History  Problem Relation Age of Onset  . Hypertension Mother   . Hypertension Maternal Grandmother   . Other Sister     Deceaesd, 52 from blood clot  . Bipolar disorder Sister   . Healthy Son     x2  . Healthy Daughter     x1    Social History   Social History  . Marital Status: Married    Spouse Name: N/A  . Number of Children: N/A  . Years of Education: N/A   Occupational History  . Not on file.   Social History Main Topics  . Smoking status: Never Smoker   . Smokeless tobacco: Not on file  . Alcohol Use: No  . Drug Use: No  . Sexual Activity:    Partners: Male   Other Topics Concern  . Not on file   Social History Narrative   She live with husband and three children.   She works part-time at [employer] level of education:  Some college     Objective: BP 127/84 mmHg  Pulse 75  Wt 199 lb (90.266 kg)  Vital signs  reviewed. General: Alert and Oriented, No Acute Distress HEENT: Pupils equal, round, reactive to light. Conjunctivae clear.  External ears unremarkable.  Moist mucous membranes. Lungs: Clear and comfortable work of breathing, speaking in full sentences without accessory muscle use. Cardiac: Regular rate and rhythm.  Neuro: CN II-XII grossly intact, gait normal. Extremities: No peripheral edema.  Strong peripheral pulses.  Mental Status: No depression, anxiety, nor agitation. Logical though process. Skin: Warm and dry. Mild erythema in the right axillary region. Bedside ultrasound shows no sign of fluid-filled chamber or pus underneath the site in question.  Assessment & Plan: [name] [name] was seen today for recurrent skin infections.  Diagnoses and all orders for this visit:  Folliculitis -     clindamycin (CLEOCIN) 300 MG capsule; Take 1 capsule (300 mg total) by mouth 3 (three) times daily.  Excess skin of breast -     Ambulatory referral to Plastic Surgery   Mild folliculitis in the axillary region therefore start clindamycin. If she is looking for ultimate relief and resolution of her excess skin in the right axillary region she'll need to see a plastic surgeon to discuss the possibility of surgically removing this.  Return if symptoms  worsen or fail to improve.

## 2015-12-29 MED FILL — ZOLPIDEM TART ER 12.5 MG TA: 12.5 | 30 days supply | Qty: 30 | Fill #3

## 2016-01-01 MED FILL — PENICILLIN VK 500 MG TABLET: 500 | 7 days supply | Qty: 28 | Fill #0

## 2016-01-01 MED FILL — HYDROCODON-APAP 7.5-325: 7.5-325 | 4 days supply | Qty: 15 | Fill #0

## 2016-01-27 ENCOUNTER — Other Ambulatory Visit: Payer: Self-pay | Admitting: Family Medicine

## 2016-01-29 ENCOUNTER — Telehealth: Payer: Self-pay

## 2016-01-29 ENCOUNTER — Other Ambulatory Visit: Payer: Self-pay

## 2016-01-29 MED ORDER — ZOLPIDEM TARTRATE ER 12.5 MG PO TBCR
12.5000 mg | EXTENDED_RELEASE_TABLET | Freq: Every evening | ORAL | Status: DC | PRN
Start: 2016-01-29 — End: 2016-03-11

## 2016-01-29 MED ORDER — ESZOPICLONE 3 MG PO TABS: 3.0000 mg | ORAL_TABLET | Freq: Every day | ORAL | Status: DC

## 2016-01-29 NOTE — Telephone Encounter (Signed)
Pt was confused and did want a refill on eszopiclone.   (FYI for me send Rx to [facility] on [address])

## 2016-01-29 NOTE — Telephone Encounter (Signed)
Pt is requesting a refill of Ambien 12.5 mg.  She stated that the other medication did not help.

## 2016-01-29 NOTE — Telephone Encounter (Signed)
Can you ask her if she really meant the generic form of Lunesta (eszopiclone), Ambien was discontinued at the end of 2016 because it lost it's effect and caused side effects.

## 2016-01-29 NOTE — Telephone Encounter (Signed)
[place], Rx placed in in-box ready for pickup/faxing.  

## 2016-01-29 NOTE — Telephone Encounter (Signed)
Pt is requesting Ambien but eszopiclone is on her med list. Please advise.

## 2016-01-30 MED FILL — ZOLPIDEM TART ER 12.5 MG TA: 12.5 | 30 days supply | Qty: 30 | Fill #0

## 2016-01-30 NOTE — Telephone Encounter (Signed)
Rx faxed

## 2016-02-25 DIAGNOSIS — Q831 Accessory breast: Secondary | ICD-10-CM | POA: Diagnosis not present

## 2016-02-25 DIAGNOSIS — Z1231 Encounter for screening mammogram for malignant neoplasm of breast: Secondary | ICD-10-CM | POA: Diagnosis not present

## 2016-02-26 ENCOUNTER — Other Ambulatory Visit: Payer: Self-pay | Admitting: Family Medicine

## 2016-02-26 MED FILL — ZOLPIDEM TART ER 12.5 MG TA: 12.5 | 30 days supply | Qty: 30 | Fill #1

## 2016-02-27 DIAGNOSIS — N63 Unspecified lump in breast: Secondary | ICD-10-CM | POA: Diagnosis not present

## 2016-02-27 DIAGNOSIS — R928 Other abnormal and inconclusive findings on diagnostic imaging of breast: Secondary | ICD-10-CM | POA: Diagnosis not present

## 2016-03-01 DIAGNOSIS — Z9889 Other specified postprocedural states: Secondary | ICD-10-CM | POA: Diagnosis not present

## 2016-03-01 DIAGNOSIS — Z6830 Body mass index (BMI) 30.0-30.9, adult: Secondary | ICD-10-CM | POA: Diagnosis not present

## 2016-03-01 DIAGNOSIS — Q831 Accessory breast: Secondary | ICD-10-CM | POA: Diagnosis not present

## 2016-03-01 DIAGNOSIS — E669 Obesity, unspecified: Secondary | ICD-10-CM | POA: Diagnosis not present

## 2016-03-01 DIAGNOSIS — I10 Essential (primary) hypertension: Secondary | ICD-10-CM | POA: Diagnosis not present

## 2016-03-11 ENCOUNTER — Encounter: Payer: Self-pay | Admitting: Family Medicine

## 2016-03-11 ENCOUNTER — Ambulatory Visit (INDEPENDENT_AMBULATORY_CARE_PROVIDER_SITE_OTHER): Payer: 59 | Admitting: Family Medicine

## 2016-03-11 VITALS — BP 116/81 | HR 96 | Temp 99.0°F | Wt 198.0 lb

## 2016-03-11 DIAGNOSIS — F411 Generalized anxiety disorder: Secondary | ICD-10-CM

## 2016-03-11 DIAGNOSIS — G4700 Insomnia, unspecified: Secondary | ICD-10-CM | POA: Diagnosis not present

## 2016-03-11 DIAGNOSIS — I10 Essential (primary) hypertension: Secondary | ICD-10-CM

## 2016-03-11 DIAGNOSIS — L03119 Cellulitis of unspecified part of limb: Secondary | ICD-10-CM

## 2016-03-11 MED ORDER — METOPROLOL SUCCINATE ER 100 MG PO TB24: 100.0000 mg | ORAL_TABLET | Freq: Every day | ORAL | Status: DC

## 2016-03-11 MED ORDER — MONTELUKAST SODIUM 10 MG PO TABS: 10.0000 mg | ORAL_TABLET | Freq: Every day | ORAL | Status: DC

## 2016-03-11 MED ORDER — ZOLPIDEM TARTRATE ER 12.5 MG PO TBCR: 12.5000 mg | EXTENDED_RELEASE_TABLET | Freq: Every evening | ORAL | Status: DC | PRN

## 2016-03-11 MED ORDER — CLINDAMYCIN HCL 300 MG PO CAPS: 300.0000 mg | ORAL_CAPSULE | Freq: Three times a day (TID) | ORAL | Status: DC

## 2016-03-11 MED ORDER — DULOXETINE HCL 30 MG PO CPEP: 30.0000 mg | ORAL_CAPSULE | Freq: Every day | ORAL | Status: DC

## 2016-03-11 MED FILL — DULoxetine HCL 30 MG CPEP: 30 | 90 days supply | Qty: 90 | Fill #0

## 2016-03-11 MED FILL — CLINDAMYCIN HCL 300 MG CAPS: 300 | 10 days supply | Qty: 30 | Fill #0

## 2016-03-11 MED FILL — METOPROLOL SUCC ER 100 MG T: 100 | 90 days supply | Qty: 90 | Fill #0

## 2016-03-11 MED FILL — MONTELUKAST SOD 10 MG TAB: 10 | 90 days supply | Qty: 90 | Fill #0

## 2016-03-11 NOTE — Progress Notes (Signed)
CC: [name] [name] is a 40 y.o. female is here for Medication Refill   Subjective: HPI:  Follow-up anxiety: She ran Lexapro about a week ago and she is noticing that it made her anxiety worse. She also looks back and believes that she was still experiencing some degree of stress that was interfering with her quality of life even when she was taking 20 mg of Lexapro a day. She wants to know if the dose increased. She's not really sure what's contributing to her anxiety. She denies any depression or any other mental disturbance.  She would like a refill on Ambien. She tells me has been helpful with helping her fall asleep without any difficulty. She denies any known side effects from the medication. If she doesn't take medication she has difficulty falling asleep within an hour.  Follow-up essential hypertension: Continues to take metoprolol on a daily basis. No outside blood pressures to report. Denies chest pain shortness of breath with talking or peripheral edema.  Yesterday she had drains taken out of her axillary surgical site. Since that she's had a low-grade fever of 100.3 and subjective fever. She denies any nasal congestion, cough, or sore throat   Review Of Systems Outlined In HPI  Past Medical History  Diagnosis Date  . Obesity 01/02/2014  . Asthma, chronic 01/02/2014  . Essential hypertension, benign 01/02/2014  . Insomnia   . Anxiety     Past Surgical History  Procedure Laterality Date  . Gallbladder removed  2001  . Breast reduction surgery  2001  . Lipo suction  2012  . Tubal ligation  2010  . Cholecystectomy     Family History  Problem Relation Age of Onset  . Hypertension Mother   . Hypertension Maternal Grandmother   . Other Sister     Deceaesd, 31 from blood clot  . Bipolar disorder Sister   . Healthy Son     x2  . Healthy Daughter     x1    Social History   Social History  . Marital Status: Married    Spouse Name: N/A  . Number of Children: N/A   . Years of Education: N/A   Occupational History  . Not on file.   Social History Main Topics  . Smoking status: Never Smoker   . Smokeless tobacco: Not on file  . Alcohol Use: No  . Drug Use: No  . Sexual Activity:    Partners: Male   Other Topics Concern  . Not on file   Social History Narrative   She live with husband and three children.   She works part-time at [employer] level of education:  Some college     Objective: BP 116/81 mmHg  Pulse 96  Temp(Src) 99 F (37.2 C) (Oral)  Wt 198 lb (89.812 kg)  General: Alert and Oriented, No Acute Distress HEENT: Pupils equal, round, reactive to light. Conjunctivae clear. Moist mucus membranes times unremarkable  Lungs: Clear to auscultation bilaterally, no wheezing/ronchi/rales.  Comfortable work of breathing. Good air movement. Cardiac: Regular rate and rhythm. Normal S1/S2.  No murmurs, rubs, nor gallops.   Extremities: No peripheral edema.  Strong peripheral pulses.  Mental Status: No depression, anxiety, nor agitation. Skin: Warm and dry. Erythematous and warmth but only to a mild degree surrounding her right-sided axillary surgical site.  Assessment & Plan: [name] [name] was seen today for medication refill.  Diagnoses and all orders for this visit:  Generalized anxiety disorder -  DULoxetine (CYMBALTA) 30 MG capsule; Take 1 capsule (30 mg total) by mouth daily.  Insomnia  Essential hypertension, benign -     metoprolol succinate (TOPROL-XL) 100 MG 24 hr tablet; Take 1 tablet (100 mg total) by mouth daily. Take with or immediately following a meal.  Cellulitis of axilla, unspecified laterality -     clindamycin (CLEOCIN) 300 MG capsule; Take 1 capsule (300 mg total) by mouth 3 (three) times daily.  Other orders -     montelukast (SINGULAIR) 10 MG tablet; Take 1 tablet (10 mg total) by mouth at bedtime. -     zolpidem (AMBIEN CR) 12.5 MG CR tablet; Take 1 tablet (12.5 mg total) by mouth at bedtime  as needed for sleep.  Anxiety: Uncontrolled chronic condition do not restart Lexapro, start Cymbalta. Insomnia: Controlled with Ambien Cellulitis of the axilla: Start clindamycin Essential hypertension: Controlled with metoprolol   Return in about 3 months (around 06/11/2016) for Mood.

## 2016-03-25 MED FILL — ZOLPIDEM TART ER 12.5 MG TA: 12.5 | 30 days supply | Qty: 30 | Fill #0

## 2016-04-20 MED FILL — ZOLPIDEM TART ER 12.5 MG TA: 12.5 | 30 days supply | Qty: 30 | Fill #1

## 2016-05-19 MED FILL — ZOLPIDEM TART ER 12.5 MG TA: 12.5 | 30 days supply | Qty: 30 | Fill #2

## 2016-05-20 DIAGNOSIS — Z01419 Encounter for gynecological examination (general) (routine) without abnormal findings: Secondary | ICD-10-CM | POA: Diagnosis not present

## 2016-06-06 IMAGING — CR DG CHEST 2V
2 series · 2 of 2 positions shown · non-contrast
Comparison: None.

CLINICAL DATA: Cough and congestion.

EXAM:
CHEST  2 VIEW

[chest pa]
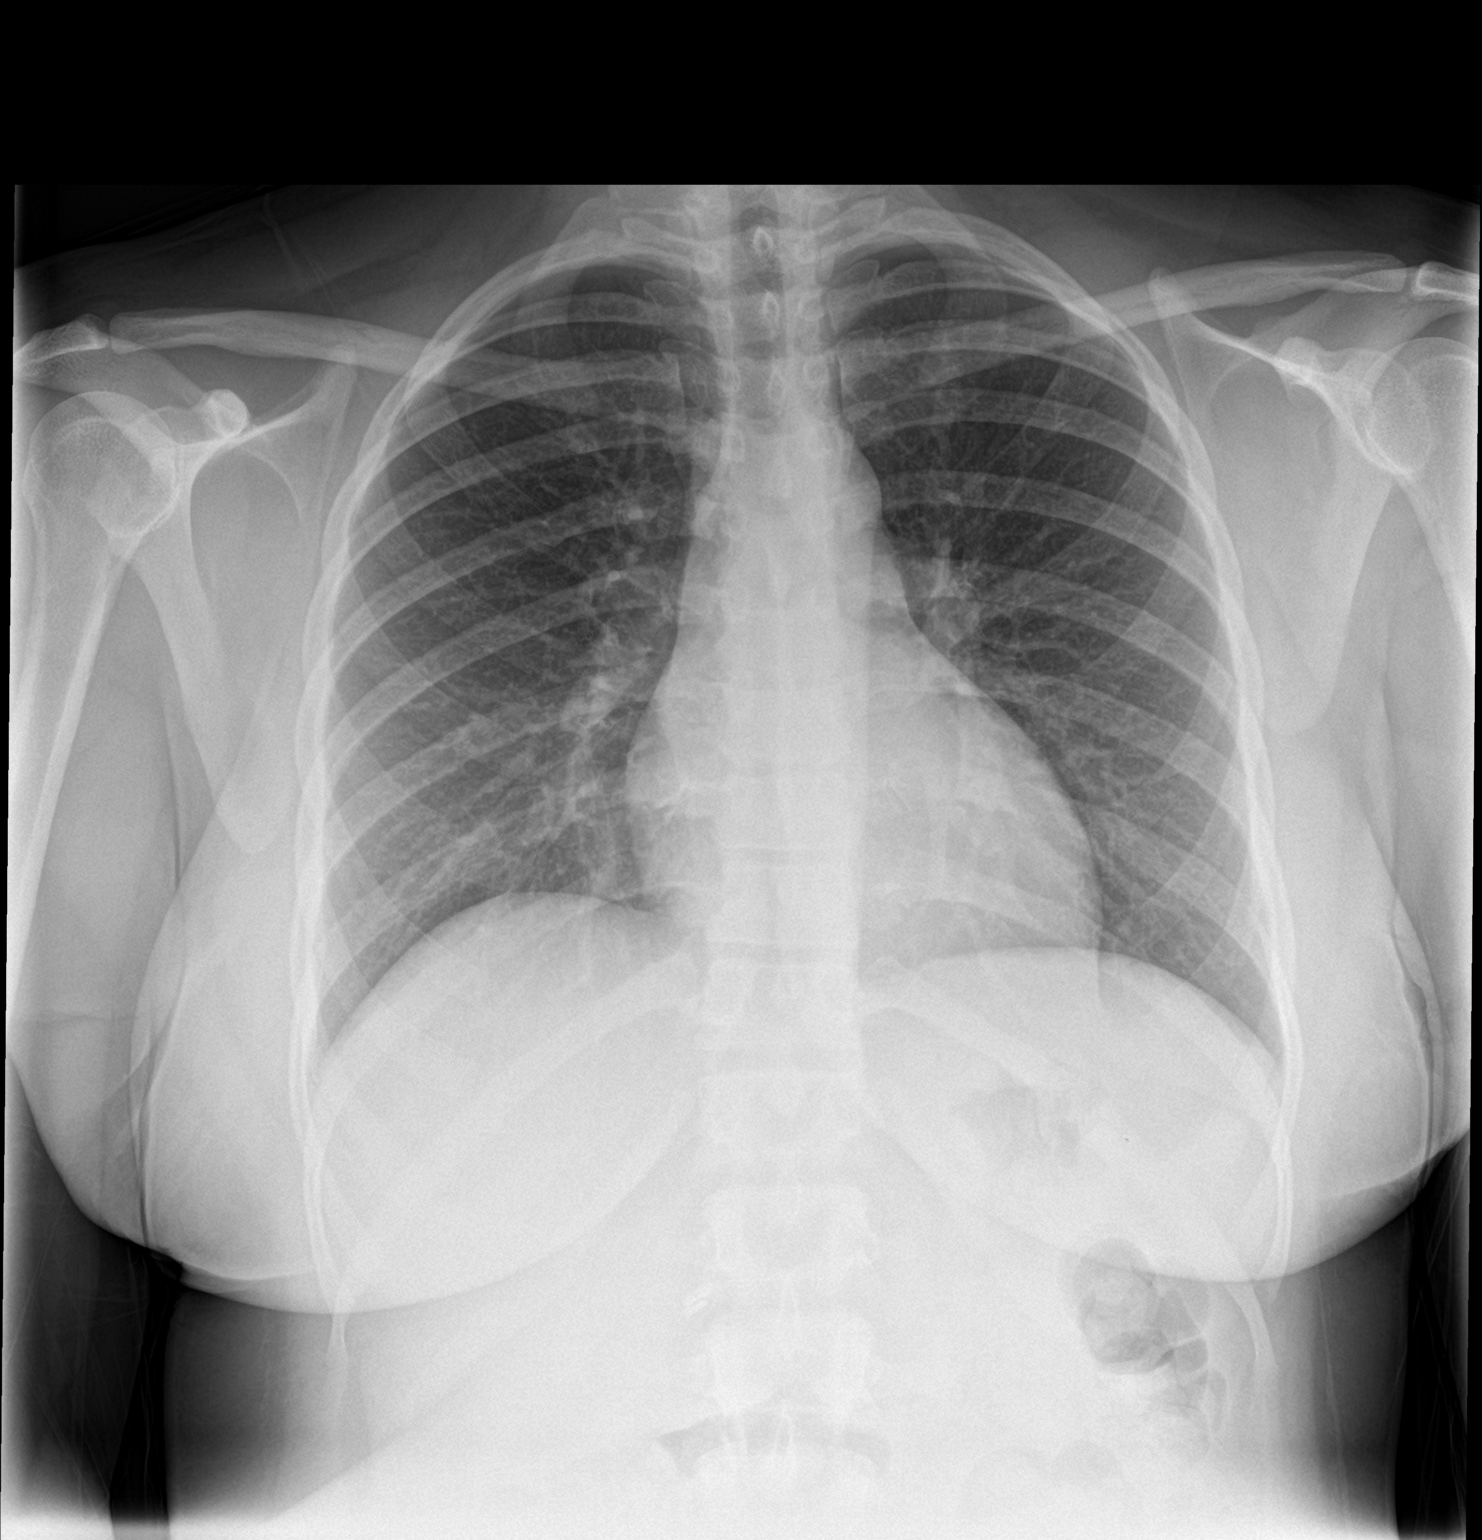

[chest lat]
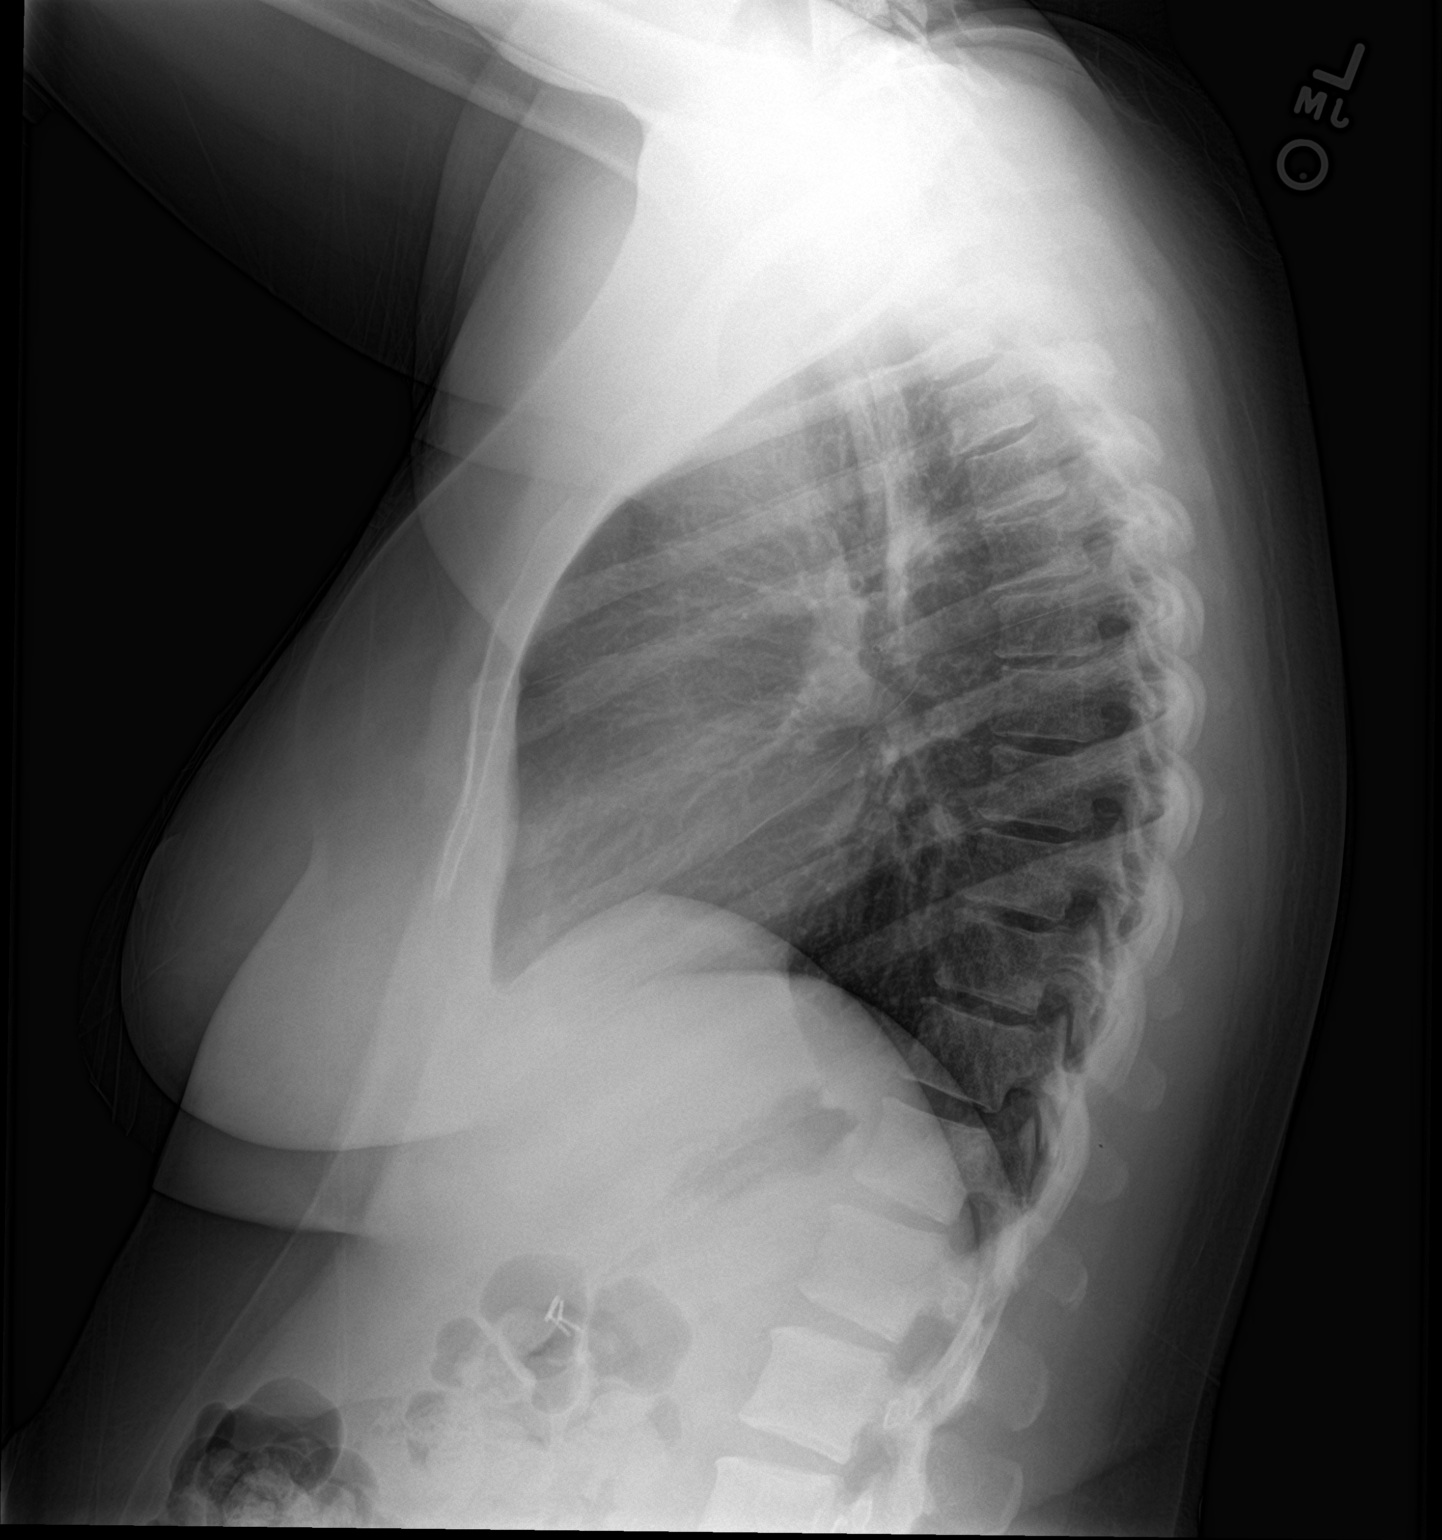

[2 of 2 positions shown; findings below may reference images not displayed]

FINDINGS: Mediastinum and hilar structures normal. Lungs are clear. Heart size
normal. No pleural effusion or pneumothorax. No acute bony
abnormality.
IMPRESSION: No acute cardiopulmonary disease.

## 2016-06-07 MED FILL — DULoxetine HCL 30 MG CPEP: 30 | 90 days supply | Qty: 90 | Fill #1

## 2016-06-07 MED FILL — METOPROLOL SUCC ER 100 MG T: 100 | 90 days supply | Qty: 90 | Fill #1

## 2016-06-21 ENCOUNTER — Other Ambulatory Visit: Payer: Self-pay

## 2016-06-21 MED ORDER — ZOLPIDEM TARTRATE ER 12.5 MG PO TBCR: 12.5000 mg | EXTENDED_RELEASE_TABLET | Freq: Every evening | ORAL | 0 refills | Status: DC | PRN

## 2016-06-21 MED FILL — ZOLPIDEM TART ER 12.5 MG TA: 12.5 | 30 days supply | Qty: 30 | Fill #0

## 2016-07-19 ENCOUNTER — Encounter: Payer: Self-pay | Admitting: Osteopathic Medicine

## 2016-07-19 ENCOUNTER — Ambulatory Visit (INDEPENDENT_AMBULATORY_CARE_PROVIDER_SITE_OTHER): Payer: 59 | Admitting: Osteopathic Medicine

## 2016-07-19 ENCOUNTER — Other Ambulatory Visit: Payer: Self-pay | Admitting: Osteopathic Medicine

## 2016-07-19 VITALS — BP 127/83 | HR 75 | Ht 62.0 in | Wt 205.0 lb

## 2016-07-19 DIAGNOSIS — R7301 Impaired fasting glucose: Secondary | ICD-10-CM | POA: Diagnosis not present

## 2016-07-19 DIAGNOSIS — Z23 Encounter for immunization: Secondary | ICD-10-CM

## 2016-07-19 DIAGNOSIS — R635 Abnormal weight gain: Secondary | ICD-10-CM | POA: Diagnosis not present

## 2016-07-19 DIAGNOSIS — F5101 Primary insomnia: Secondary | ICD-10-CM

## 2016-07-19 DIAGNOSIS — F411 Generalized anxiety disorder: Secondary | ICD-10-CM

## 2016-07-19 DIAGNOSIS — I10 Essential (primary) hypertension: Secondary | ICD-10-CM | POA: Diagnosis not present

## 2016-07-19 LAB — COMPLETE METABOLIC PANEL WITH GFR
ALT: 18 U/L (ref 6–29)
AST: 16 U/L (ref 10–30)
Albumin: 4.1 g/dL (ref 3.6–5.1)
Alkaline Phosphatase: 55 U/L (ref 33–115)
BILIRUBIN TOTAL: 0.5 mg/dL (ref 0.2–1.2)
BUN: 12 mg/dL (ref 7–25)
CHLORIDE: 104 mmol/L (ref 98–110)
CO2: 25 mmol/L (ref 20–31)
Calcium: 9.2 mg/dL (ref 8.6–10.2)
Creat: 0.74 mg/dL (ref 0.50–1.10)
GLUCOSE: 119 mg/dL — AB (ref 65–99)
POTASSIUM: 4.3 mmol/L (ref 3.5–5.3)
SODIUM: 139 mmol/L (ref 135–146)
TOTAL PROTEIN: 7 g/dL (ref 6.1–8.1)

## 2016-07-19 LAB — CBC WITH DIFFERENTIAL/PLATELET
BASOS ABS: 0 {cells}/uL (ref 0–200)
Basophils Relative: 0 %
EOS ABS: 190 {cells}/uL (ref 15–500)
EOS PCT: 2 %
HCT: 38.8 % (ref 35.0–45.0)
Hemoglobin: 12.7 g/dL (ref 11.7–15.5)
LYMPHS ABS: 2850 {cells}/uL (ref 850–3900)
LYMPHS PCT: 30 %
MCH: 27.7 pg (ref 27.0–33.0)
MCHC: 32.7 g/dL (ref 32.0–36.0)
MCV: 84.5 fL (ref 80.0–100.0)
MONO ABS: 855 {cells}/uL (ref 200–950)
MONOS PCT: 9 %
MPV: 10.8 fL (ref 7.5–12.5)
NEUTROS PCT: 59 %
Neutro Abs: 5605 cells/uL (ref 1500–7800)
PLATELETS: 262 10*3/uL (ref 140–400)
RBC: 4.59 MIL/uL (ref 3.80–5.10)
RDW: 13.4 % (ref 11.0–15.0)
WBC: 9.5 10*3/uL (ref 3.8–10.8)

## 2016-07-19 LAB — LIPID PANEL
CHOL/HDL RATIO: 3.3 ratio (ref <=5.0)
Cholesterol: 145 mg/dL (ref 125–200)
HDL: 44 mg/dL — AB (ref >=46)
LDL CALC: 86 mg/dL (ref <130)
TRIGLYCERIDES: 73 mg/dL (ref <150)
VLDL: 15 mg/dL (ref <30)

## 2016-07-19 LAB — TSH: TSH: 2.04 mIU/L

## 2016-07-19 MED ORDER — ZOLPIDEM TARTRATE ER 12.5 MG PO TBCR: 12.5000 mg | EXTENDED_RELEASE_TABLET | Freq: Every evening | ORAL | 3 refills | Status: DC | PRN

## 2016-07-19 NOTE — Patient Instructions (Signed)
Things to remember for exercise for weight loss: Gradually increasing the intensity, frequency, or duration of your workout. Increased interval training and muscle strengthening exercises will help overall fitness and weight loss.   Things to remember for diet changes for weight loss: Calorie restriction with the goal weight loss of no more than one to one and a half pounds per week. Increase lean protein such as chicken, fish, [place]. Decrease fatty foods such as dairy, better. Decrease sugary foods. Increase fiber founded fruit and vegetables. Avoid sugary drinks such as soda or juice.  Medications approved for long-term use for obesity if you can show me some success with diet/exercise...   Qsymia (Phentermine and Topiramate)  Saxenda (Liraglutide 3 mg/day)  Contrave (Bupropion and Naltrexone)  Lorcaserin (Belviq or Belviq XR)  Orlistat (Xenical, Alli)  Bupropion (Wellbutrin) If we start with phenetamine, after about 12 weeks on the phentermine, we will need to consider transition to one of the above medications if you're interested in continuing long-term treatment with medications. I recommend that you research the above medications and see which one(s) your insurance may or may not cover: If you call your insurance, ask them specifically what medications are on their formulary that are approved for obesity treatment. They should be able to send you a list or tell you over the phone.

## 2016-07-19 NOTE — Progress Notes (Signed)
HPI: [name] [name] is a 40 y.o. female  who presents to [facility] [place] today, 07/19/16,  for chief complaint of:  Chief Complaint  Patient presents with  . Establish Care    FOLLOW UP MEDICATION     Previous patient of Dr. Ileene Rubens here to establish care with me.  Anxiety: Previously on Lexapro, currently well-controlled on Cymbalta. See below for GAD 7. Patient with acute continue current medication.  Essential hypertension: Stable on metoprolol, no headache, chest pain, vision change.  Insomnia: Patient on Ambien pretty much every night. Has tried to discontinue this medication but finds that she can't get to sleep at all. Requests refill.  Weight gain: Patient frustrated with inability to lose weight. Has cut out starches, is not calorie counting or portion controlling, cooks mostly chicken and vegetables at home. Has a gym membership that isn't going, she has a treadmill at home that she is using.    Past medical, surgical, social and family history reviewed: Past Medical History:  Diagnosis Date  . Anxiety   . Asthma, chronic 01/02/2014  . Essential hypertension, benign 01/02/2014  . Insomnia   . Obesity 01/02/2014   Past Surgical History:  Procedure Laterality Date  . BREAST REDUCTION SURGERY  2001  . CHOLECYSTECTOMY    . gallbladder removed  2001  . lipo suction  2012  . TUBAL LIGATION  2010   Social History  Substance Use Topics  . Smoking status: Never Smoker  . Smokeless tobacco: Not on file  . Alcohol use No   Family History  Problem Relation Age of Onset  . Hypertension Mother   . Hypertension Maternal Grandmother   . Other Sister     Deceaesd, 13 from blood clot  . Bipolar disorder Sister   . Healthy Son     x2  . Healthy Daughter     x1     Current medication list and allergy/intolerance information reviewed:   Current Outpatient Prescriptions  Medication Sig Dispense Refill  . DULoxetine (CYMBALTA) 30 MG  capsule Take 1 capsule (30 mg total) by mouth daily. 90 capsule 1  . metoprolol succinate (TOPROL-XL) 100 MG 24 hr tablet Take 1 tablet (100 mg total) by mouth daily. Take with or immediately following a meal. 90 tablet 1  . montelukast (SINGULAIR) 10 MG tablet Take 1 tablet (10 mg total) by mouth at bedtime. 90 tablet 1  . zolpidem (AMBIEN CR) 12.5 MG CR tablet Take 1 tablet (12.5 mg total) by mouth at bedtime as needed for sleep. NEED FOLLOW UP APPOINTMENT FOR MORE REFILLS 30 tablet 0   No current facility-administered medications for this visit.    No Known Allergies    Review of Systems:  Constitutional:  No  fever, no chills, No recent illness  HEENT: No  headache, no vision change  Cardiac: No  chest pain, No  pressure  Respiratory:  No  shortness of breath  Gastrointestinal: No  abdominal pain, No  nausea  Neurologic: No  weakness, No  dizziness  Psychiatric: No  concerns with depression, No  concerns with anxiety, +sleep problems, No mood problems  Exam:  BP 127/83   Pulse 75   Ht 5\' 2"  (1.575 m)   Wt 205 lb (93 kg)   BMI 37.49 kg/m   Constitutional: VS see above. General Appearance: alert, well-developed, well-nourished, NAD  Eyes: Normal lids and conjunctive, non-icteric sclera  Ears, Nose, Mouth, Throat: MMM,  Neck: No masses, trachea midline. No  thyroid enlargement. No tenderness/mass appreciated. No lymphadenopathy  Respiratory: Normal respiratory effort. no wheeze, no rhonchi, no rales  Cardiovascular: S1/S2 normal, no murmur, no rub/gallop auscultated. RRR.   Skin: warm, dry, intact.   Psychiatric: Normal judgment/insight. Normal mood and affect. Oriented x3.     ASSESSMENT/PLAN:   Okay to refill Ambien, patient advised try going without this medication periodically to see if continuation is truly necessary.   Had a long discussion about lifestyle changes with regard to successful weight loss. Patient advised she can start keeping record of  diet/exercise weather throughout or in a notebook, she is able to lose some weight with lifestyle changes can consider medication as noted below in patient instructions.  Weight gain  Essential hypertension, benign - Plan: CBC with Differential/Platelet, COMPLETE METABOLIC PANEL WITH GFR, Lipid panel, TSH  Need for prophylactic vaccination and inoculation against influenza - Plan: Flu Vaccine QUAD 36+ mos IM  Generalized anxiety disorder  Primary insomnia   Patient Instructions  Things to remember for exercise for weight loss: Gradually increasing the intensity, frequency, or duration of your workout. Increased interval training and muscle strengthening exercises will help overall fitness and weight loss.   Things to remember for diet changes for weight loss: Calorie restriction with the goal weight loss of no more than one to one and a half pounds per week. Increase lean protein such as chicken, fish, [place]. Decrease fatty foods such as dairy, better. Decrease sugary foods. Increase fiber founded fruit and vegetables. Avoid sugary drinks such as soda or juice.  Medications approved for long-term use for obesity if you can show me some success with diet/exercise...   Qsymia (Phentermine and Topiramate)  Saxenda (Liraglutide 3 mg/day)  Contrave (Bupropion and Naltrexone)  Lorcaserin (Belviq or Belviq XR)  Orlistat (Xenical, Alli)  Bupropion (Wellbutrin) If we start with phenetamine, after about 12 weeks on the phentermine, we will need to consider transition to one of the above medications if you're interested in continuing long-term treatment with medications. I recommend that you research the above medications and see which one(s) your insurance may or may not cover: If you call your insurance, ask them specifically what medications are on their formulary that are approved for obesity treatment. They should be able to send you a list or tell you over the phone.      Visit summary  with medication list and pertinent instructions was printed for patient to review. All questions at time of visit were answered - patient instructed to contact office with any additional concerns. ER/RTC precautions were reviewed with the patient. Follow-up plan: Return for weight loss discussion at your convenience, and BP check and Rx refill in 6 months.  Note: Total time spent 25 minutes, greater than 50% of the visit was spent face-to-face counseling and coordinating care for the following: The primary encounter diagnosis was Weight gain. Diagnoses of Essential hypertension, benign, Need for prophylactic vaccination and inoculation against influenza, Generalized anxiety disorder, and Primary insomnia were also pertinent to this visit.Marland Kitchen

## 2016-07-21 LAB — HEMOGLOBIN A1C
Hgb A1c MFr Bld: 5.4 % (ref <5.7)
Mean Plasma Glucose: 108 mg/dL

## 2016-07-21 MED FILL — ZOLPIDEM TART ER 12.5 MG TA: 12.5 | 30 days supply | Qty: 30 | Fill #0

## 2016-07-27 ENCOUNTER — Telehealth: Payer: Self-pay

## 2016-07-27 NOTE — Telephone Encounter (Signed)
Pt reports that she is wanting her BP med changed.  She stated that one of her legs is retaining fluids and she thinks its because of her BP med. Please advise.

## 2016-07-28 NOTE — Telephone Encounter (Signed)
Left recommendations on vm pt advised to call an make appointment

## 2016-07-28 NOTE — Telephone Encounter (Signed)
If one leg is swelling noticeably more than another, patient needs to come in here or be seen in urgent care for further evaluation as this is unlikely to be due to medication side effects. On her list, I have metoprolol as the only thing she is taking for blood pressure, it is very rare for this medication to cause swelling issues.

## 2016-08-19 MED FILL — ZOLPIDEM TART ER 12.5 MG TA: 12.5 | 30 days supply | Qty: 30 | Fill #1

## 2016-08-25 DIAGNOSIS — R600 Localized edema: Secondary | ICD-10-CM | POA: Diagnosis not present

## 2016-09-15 ENCOUNTER — Other Ambulatory Visit: Payer: Self-pay | Admitting: *Deleted

## 2016-09-15 DIAGNOSIS — I10 Essential (primary) hypertension: Secondary | ICD-10-CM

## 2016-09-15 MED ORDER — METOPROLOL SUCCINATE ER 100 MG PO TB24: 100.0000 mg | ORAL_TABLET | Freq: Every day | ORAL | 0 refills | Status: DC

## 2016-09-15 MED FILL — METOPROLOL SUCC ER 100 MG T: 100 | 90 days supply | Qty: 90 | Fill #0

## 2016-09-16 ENCOUNTER — Other Ambulatory Visit: Payer: Self-pay | Admitting: *Deleted

## 2016-09-16 DIAGNOSIS — F411 Generalized anxiety disorder: Secondary | ICD-10-CM

## 2016-09-16 MED ORDER — DULOXETINE HCL 30 MG PO CPEP: 30.0000 mg | ORAL_CAPSULE | Freq: Every day | ORAL | 0 refills | Status: DC

## 2016-09-16 MED FILL — ZOLPIDEM TART ER 12.5 MG TA: 12.5 | 30 days supply | Qty: 30 | Fill #2

## 2016-09-16 MED FILL — DULoxetine HCL 30 MG CPEP: 30 | 90 days supply | Qty: 90 | Fill #0

## 2016-10-13 MED FILL — ZOLPIDEM TART ER 12.5 MG TA: 12.5 | 30 days supply | Qty: 30 | Fill #3

## 2016-10-13 MED FILL — MONTELUKAST SOD 10 MG TAB: 10 | 90 days supply | Qty: 90 | Fill #1

## 2016-10-15 DIAGNOSIS — H52223 Regular astigmatism, bilateral: Secondary | ICD-10-CM | POA: Diagnosis not present

## 2016-10-15 DIAGNOSIS — H5203 Hypermetropia, bilateral: Secondary | ICD-10-CM | POA: Diagnosis not present

## 2016-10-15 DIAGNOSIS — H524 Presbyopia: Secondary | ICD-10-CM | POA: Diagnosis not present

## 2016-11-08 ENCOUNTER — Other Ambulatory Visit: Payer: Self-pay | Admitting: Osteopathic Medicine

## 2016-11-09 MED FILL — ZOLPIDEM TART ER 12.5 MG TA: 12.5 | 30 days supply | Qty: 30 | Fill #0

## 2016-11-30 DIAGNOSIS — Z7689 Persons encountering health services in other specified circumstances: Secondary | ICD-10-CM | POA: Diagnosis not present

## 2016-12-02 ENCOUNTER — Other Ambulatory Visit: Payer: Self-pay | Admitting: Osteopathic Medicine

## 2016-12-02 DIAGNOSIS — I10 Essential (primary) hypertension: Secondary | ICD-10-CM

## 2016-12-02 MED FILL — METOPROLOL SUCC ER 100 MG T: 100 | 90 days supply | Qty: 90 | Fill #0

## 2016-12-03 MED FILL — HYDROCODON-APAP 5-325: 5-325 | 5 days supply | Qty: 30 | Fill #0

## 2016-12-03 MED FILL — ONDANSETRON ODT 4 MG TABLET: 4 | 5 days supply | Qty: 20 | Fill #0

## 2016-12-10 MED FILL — ZOLPIDEM TART ER 12.5 MG TA: 12.5 | 30 days supply | Qty: 30 | Fill #1

## 2016-12-13 ENCOUNTER — Encounter: Payer: Self-pay | Admitting: Osteopathic Medicine

## 2016-12-13 ENCOUNTER — Ambulatory Visit (INDEPENDENT_AMBULATORY_CARE_PROVIDER_SITE_OTHER): Payer: 59 | Admitting: Osteopathic Medicine

## 2016-12-13 VITALS — BP 154/94 | HR 69 | Ht 62.0 in | Wt 207.0 lb

## 2016-12-13 DIAGNOSIS — Z0000 Encounter for general adult medical examination without abnormal findings: Secondary | ICD-10-CM | POA: Diagnosis not present

## 2016-12-13 DIAGNOSIS — I10 Essential (primary) hypertension: Secondary | ICD-10-CM

## 2016-12-13 DIAGNOSIS — Z139 Encounter for screening, unspecified: Secondary | ICD-10-CM | POA: Diagnosis not present

## 2016-12-13 NOTE — Progress Notes (Signed)
HPI: [name] [name] is a 41 y.o. female  who presents to [facility] today, 12/13/16,  for chief complaint of:  Chief Complaint  Patient presents with  . Other    SURGICAL CLEARANCE     [name] [name] is seen 12/13/16 for peri-operative risk stratification.    [name] [name] denies chest pain at rest or with exertion, denies dyspnea at rest or with exertion,denies lower extremity edema, denies claudication or palpitations.  Patient has negative history of ischemic heart disease, CHF, CVD, diabetes, alcohol or drug abuse, recent anticoagulation or antithrombotic use, personal or family history of quite neuropathy, or chronic kidney disease.   Patient hasnegative history of undergoing cardiac stress test or catheterization, coronary revascularization.   Patient reports being able to achieve 4-10 MET of activity during routine daily activities, limited strenuous exercise.   Patient's cardiac risk factors include HTN.   According to the RCR I, this number of risk factors stratify as the patient to class 0 which carries with it a 0.4 percent risk of major cardiovascular complications such as MI, CHF, malignant arrhythmia.   In this case however the RCR I likely slightly under-estimates the patient's true cardiac risk given her history of HTN, mild abn EKG   The above risks, along with the risk of perioperative stroke, were discussed with the patient and her family, in light of the benefits of possible surgery. Patient and family wish to proceed with the operation. This assessment was conveyed to the surgery team via letter which was provided to the patient along with copies of EKGs    Past medical history, surgical history, social history and family history reviewed.  Patient Active Problem List   Diagnosis Date Noted  . Weight gain 07/19/2016  . Postoperative state 06/30/2015  . Impaired fasting glucose 03/03/2015  . Generalized  anxiety disorder 05/02/2014  . Leukocytosis 01/21/2014  . Asthma, chronic 01/02/2014  . Insomnia 01/02/2014  . Essential hypertension, benign 01/02/2014  . Obesity 01/02/2014    Current medication list and allergy/intolerance information reviewed.   Current Outpatient Prescriptions on File Prior to Visit  Medication Sig Dispense Refill  . DULoxetine (CYMBALTA) 30 MG capsule Take 1 capsule (30 mg total) by mouth daily. 90 capsule 0  . metoprolol succinate (TOPROL-XL) 100 MG 24 hr tablet Take 1 tablet (100 mg total) by mouth daily. Take with or immediately following a meal. APPOINTMENT NEEDED FOR FURTHER REFILLS 90 tablet 0  . montelukast (SINGULAIR) 10 MG tablet Take 1 tablet (10 mg total) by mouth at bedtime. 90 tablet 1  . zolpidem (AMBIEN CR) 12.5 MG CR tablet Take 1 tablet (12.5 mg total) by mouth at bedtime as needed. for sleep APPOINTMENT NEEDED FOR FURTHER REFILLS 30 tablet 0   No current facility-administered medications on file prior to visit.    No Known Allergies    Review of Systems:  Constitutional: No recent illness  HEENT: No  headache, no vision change  Cardiac: No  chest pain, No  pressure, No palpitations  Respiratory:  No  shortness of breath. No  Cough  Gastrointestinal: No  abdominal pain, no change on bowel habits  Musculoskeletal: No new myalgia/arthralgia  Skin: No  Rash  Hem/Onc: No  easy bruising/bleeding, No  abnormal lumps/bumps  Neurologic: No  weakness, No  Dizziness  Psychiatric: No  concerns with depression, No  concerns with anxiety  Exam:  BP (!) 154/94   Pulse 69   Ht 5' 2"  (1.575  m)   Wt 207 lb (93.9 kg)   BMI 37.86 kg/m   Constitutional: VS see above. General Appearance: alert, well-developed, well-nourished, NAD  Eyes: Normal lids and conjunctive, non-icteric sclera  Ears, Nose, Mouth, Throat: MMM, Normal external inspection ears/nares/mouth/lips/gums.  Neck: No masses, trachea midline.   Respiratory: Normal respiratory  effort. no wheeze, no rhonchi, no rales  Cardiovascular: S1/S2 normal, no murmur, no rub/gallop auscultated. RRR.   Musculoskeletal: Gait normal. Symmetric and independent movement of all extremities  Neurological: Normal balance/coordination. No tremor.  Skin: warm, dry, intact.   Psychiatric: Normal judgment/insight. Normal mood and affect. Oriented x3.    EKG interpretation: Rate: 76 Rhythm: sinus Inverted T in III is old  No ST/T changes concerning for acute ischemia/infarct      ASSESSMENT/PLAN: Advised to reach out to surgeon and let her know of HTN concern since recheck BP was not better and this is fairly new issue in terms of previous good BP control. Advised may defer surgery based on surgeon's assessment.   Risk and functional assessment  Examination, general medical - Plan: EKG 12-Lead  Essential hypertension, benign    Follow-up plan: Return in about 1 week (around 12/20/2016) for nurse visit to recheck blood pressure.  Visit summary with medication list and pertinent instructions was printed for patient to review, alert [place] if any changes needed. All questions at time of visit were answered - patient instructed to contact office with any additional concerns. ER/RTC precautions were reviewed with the patient and understanding verbalized.

## 2016-12-20 ENCOUNTER — Telehealth: Payer: Self-pay

## 2016-12-20 ENCOUNTER — Ambulatory Visit: Payer: 59

## 2016-12-20 MED ORDER — CHLORTHALIDONE 25 MG PO TABS: 25.0000 mg | ORAL_TABLET | Freq: Every day | ORAL | 1 refills | Status: DC

## 2016-12-20 MED FILL — CHLORTHALIDONE 25 MG TAB: 25 | 30 days supply | Qty: 30 | Fill #0

## 2016-12-20 NOTE — Telephone Encounter (Signed)
Patient called stated that her blood pressure is still running high and the current medication is not working. Patient stated that she had her surgery and blood pressure was high and they had to give her metoprolol through IV. Please advise what patient should do.she do have a follow up appointment scheduled for next week for blood pressure.  Rhonda Cunningham,CMA

## 2016-12-20 NOTE — Telephone Encounter (Signed)
Would continue Metoprolol as-is, I sent in an additional blood pressure medication to her pharmacy, please ask her to start taking this and keep follow-up appointment with me.   If she is measuring home BP, I recommend she seek care sooner if home blood pressure is persistently higher than 160 top number or 90 bottom number, or if she has any chest discomfort, dizziness or other concerns.

## 2016-12-20 NOTE — Telephone Encounter (Signed)
Left detailed message on patient vm with instructions as noted below. Advised patient to call the office back if she have any questions. Rhonda Cunningham,CMA

## 2016-12-27 ENCOUNTER — Encounter: Payer: Self-pay | Admitting: Osteopathic Medicine

## 2016-12-27 ENCOUNTER — Ambulatory Visit (INDEPENDENT_AMBULATORY_CARE_PROVIDER_SITE_OTHER): Payer: 59 | Admitting: Osteopathic Medicine

## 2016-12-27 VITALS — BP 134/86 | HR 91 | Ht 62.0 in | Wt 199.0 lb

## 2016-12-27 DIAGNOSIS — I10 Essential (primary) hypertension: Secondary | ICD-10-CM | POA: Diagnosis not present

## 2016-12-27 DIAGNOSIS — I493 Ventricular premature depolarization: Secondary | ICD-10-CM

## 2016-12-27 DIAGNOSIS — R002 Palpitations: Secondary | ICD-10-CM

## 2016-12-27 LAB — CBC
HEMATOCRIT: 38.1 % (ref 35.0–45.0)
HEMOGLOBIN: 11.9 g/dL (ref 11.7–15.5)
MCH: 27.4 pg (ref 27.0–33.0)
MCHC: 31.2 g/dL — ABNORMAL LOW (ref 32.0–36.0)
MCV: 87.8 fL (ref 80.0–100.0)
MPV: 10 fL (ref 7.5–12.5)
Platelets: 412 10*3/uL — ABNORMAL HIGH (ref 140–400)
RBC: 4.34 MIL/uL (ref 3.80–5.10)
RDW: 13.9 % (ref 11.0–15.0)
WBC: 11.4 10*3/uL — AB (ref 3.8–10.8)

## 2016-12-27 LAB — TSH: TSH: 1.97 mIU/L

## 2016-12-27 MED ORDER — METOPROLOL SUCCINATE ER 100 MG PO TB24: 100.0000 mg | ORAL_TABLET | Freq: Every day | ORAL | 3 refills | Status: DC

## 2016-12-27 MED ORDER — CHLORTHALIDONE 25 MG PO TABS: 25.0000 mg | ORAL_TABLET | Freq: Every day | ORAL | 1 refills | Status: DC

## 2016-12-27 NOTE — Progress Notes (Signed)
HPI: [name] [name] is a 41 y.o. female  who presents to [facility] today, 12/27/16,  for chief complaint of:  Chief Complaint  Patient presents with  . Follow-up    BLOOD PRESSURE    Hypertension: Blood pressure was elevated in surgery, patient has home blood pressure monitor but has not brought this into the office for verification. No chest pain or pressure but she does report some intermittent heart palpitations over the past week or so, happening once or twice per day, very short-lived and resolves spontaneously, described as flutters/twinges. Increased stress lately. No dizziness/LOC.  See telephone encounter 12/20/16: Initiated Thalitone in addition to metoprolol. Patient is currently taking both medications as listed below.    Past medical history, surgical history, social history and family history reviewed.  Patient Active Problem List   Diagnosis Date Noted  . Weight gain 07/19/2016  . Postoperative state 06/30/2015  . Impaired fasting glucose 03/03/2015  . Generalized anxiety disorder 05/02/2014  . Leukocytosis 01/21/2014  . Asthma, chronic 01/02/2014  . Insomnia 01/02/2014  . Essential hypertension, benign 01/02/2014  . Obesity 01/02/2014    Current medication list and allergy/intolerance information reviewed.   Current Outpatient Prescriptions on File Prior to Visit  Medication Sig Dispense Refill  . chlorthalidone (HYGROTON) 25 MG tablet Take 1 tablet (25 mg total) by mouth daily. 30 tablet 1  . DULoxetine (CYMBALTA) 30 MG capsule Take 1 capsule (30 mg total) by mouth daily. 90 capsule 0  . metoprolol succinate (TOPROL-XL) 100 MG 24 hr tablet Take 1 tablet (100 mg total) by mouth daily. Take with or immediately following a meal. APPOINTMENT NEEDED FOR FURTHER REFILLS 90 tablet 0  . montelukast (SINGULAIR) 10 MG tablet Take 1 tablet (10 mg total) by mouth at bedtime. 90 tablet 1  . zolpidem (AMBIEN CR) 12.5 MG CR tablet Take  1 tablet (12.5 mg total) by mouth at bedtime as needed. for sleep APPOINTMENT NEEDED FOR FURTHER REFILLS 30 tablet 0   No current facility-administered medications on file prior to visit.    No Known Allergies    Review of Systems:  Constitutional: No recent illness  HEENT: No  headache, no vision change  Cardiac: No  chest pain, No  pressure, +palpitations  Respiratory:  No  shortness of breath. No  Cough  Musculoskeletal: No new myalgia/arthralgia  Neurologic: No  weakness, No  Dizziness   Exam:  BP 134/86   Pulse 91   Ht 5\' 2"  (1.575 m)   Wt 199 lb (90.3 kg)   BMI 36.40 kg/m   Constitutional: VS see above. General Appearance: alert, well-developed, well-nourished, NAD  Eyes: Normal lids and conjunctive, non-icteric sclera  Ears, Nose, Mouth, Throat: MMM, Normal external inspection ears/nares/mouth/lips/gums.  Neck: No masses, trachea midline.   Respiratory: Normal respiratory effort. no wheeze, no rhonchi, no rales  Cardiovascular: S1/S2 normal, no murmur, no rub/gallop auscultated. RRR.   Musculoskeletal: Gait normal. Symmetric and independent movement of all extremities  Neurological: Normal balance/coordination. No tremor.  Skin: warm, dry, intact.   Psychiatric: Normal judgment/insight. Normal mood and affect. Oriented x3.    EKG interpretation: Rate: 89 Rhythm: sinus (+)PVC No ST/T changes concerning for acute ischemia/infarct    ASSESSMENT/PLAN: PVC on EKG likely cause of patient's palpitation type symptoms. Advised could get Holter monitor as well for confirmation but patient would like to hold off on this for now and reduce caffeine intake and practiced stress reduction to see if they resolve. Continue metoprolol.  Continue chlorthalidone and plan to recheck labs. Contraception w/ BTL  Palpitations - Plan: EKG 12-Lead, CBC, COMPLETE METABOLIC PANEL WITH GFR, TSH, Magnesium  Essential hypertension, benign - Plan: metoprolol succinate (TOPROL-XL)  100 MG 24 hr tablet, chlorthalidone (HYGROTON) 25 MG tablet, CBC, COMPLETE METABOLIC PANEL WITH GFR, TSH, Magnesium  PVC (premature ventricular contraction) - Plan: EKG 12-Lead    Patient Instructions  Plan:  Labs today to check kidney function on new medicine and to evaluate for other possible causes of palpitations   For palpitations, we can also consider a 48-hour Holter monitoring (monitor heart rhythm similar to EKG)   Labs again in one month to double-check kidney function and electrolytes on  New medication     Follow-up plan: Return in about 3 months (around 03/29/2017) for blood pressure follow-up, sooner if needed .  Visit summary with medication list and pertinent instructions was printed for patient to review, alert [place] if any changes needed. All questions at time of visit were answered - patient instructed to contact office with any additional concerns. ER/RTC precautions were reviewed with the patient and understanding verbalized.

## 2016-12-27 NOTE — Patient Instructions (Addendum)
Plan:  Labs today to check kidney function on new medicine and to evaluate for other possible causes of palpitations   For palpitations, we can also consider a 48-hour Holter monitoring (monitor heart rhythm similar to EKG)   Labs again in one month to double-check kidney function and electrolytes on  New medication     Premature Ventricular Contraction A premature ventricular contraction (PVC) is a common irregularity in the normal heart rhythm. These contractions are extra heartbeats that start in the heart ventricles and occur too early in the normal sequence. During the PVC, the heart's normal electrical pathway is not used, so the beat is shorter and less effective. In most cases, these contractions come and go and do not require treatment. What are the causes? In many cases, the cause may not be known. Common causes of the condition include:  Smoking.  Drinking alcohol.  Caffeine.  Certain medicines.  Some illegal drugs.  Stress. Certain medical conditions can also cause PVCs:  Changes in minerals in the blood (electrolytes).  Heart failure.  Heart valve problems.  Low blood oxygen levels or high carbon dioxide levels.  Heart attack, or coronary artery disease. What are the signs or symptoms? The main symptom of this condition is a fast or skipped heartbeat (palpitations). Other symptoms include:  Chest pain.  Shortness of breath.  Feeling tired.  Dizziness. In some cases, there are no symptoms. How is this diagnosed? This condition may be diagnosed based on:  Your medical history.  A physical exam. During the exam, the health care provider will check for irregular heartbeats.  Tests, such as:  An ECG (electrocardiogram) to monitor the electrical activity of your heart.  Holter monitor testing. This involves wearing a device that clips to your clothing and monitors the electrical activity of your heart over longer periods of time.  Stress tests to  see how exercise affects your heart rhythm and blood supply.  Echocardiogram. This test uses sound waves (ultrasound) to produce an image of your heart.  Electrophysiology study. This test checks the electric pathways in your heart. How is this treated? Treatment depends on any underlying conditions, the type of PVCs that you are having, and how much the symptoms are interfering with your daily life. Possible treatments include:  Avoiding things that can trigger the premature contractions, such as caffeine or alcohol.  Medicines. These may be given if symptoms are severe or if the extra heartbeats are frequent.  Treatment for any underlying condition that is found to be the cause of the contractions.  Catheter ablation. This procedure destroys the heart tissues that send abnormal signals. In some cases, no treatment is required. Follow these instructions at home: Lifestyle  Follow these instructions as told by your health care provider:  Do not use any products that contain nicotine or tobacco, such as cigarettes and e-cigarettes. If you need help quitting, ask your health care provider.  If caffeine triggers episodes of PVC, do not eat, drink, or use anything with caffeine in it.  If caffeine does not seem to trigger episodes, consume caffeine in moderation.  If alcohol triggers episodes of PVC, do not drink alcohol.  If alcohol does not seem to trigger episodes, limit alcohol intake to no more than 1 drink a day for nonpregnant women and 2 drinks a day for men. One drink equals 12 oz of beer, 5 oz of wine, or 1 oz of hard liquor.  Exercise regularly. Ask your health care provider what type of  exercise is safe for you.  Find healthy ways to manage stress. Avoid stressful situations when possible.  Try to get at least 7-9 hours of sleep each night, or as much as recommended by your health care provider.  Do not use illegal drugs. General instructions   Take over-the-counter  and prescription medicines only as told by your health care provider.  Keep all follow-up visits as told by your health care provider. This is important. Get help right away if:  You feel palpitations that are frequent or continual.  You have chest pain.  You have shortness of breath.  You have sweating for no reason.  You have nausea and vomiting.  You become light-headed or you faint. This information is not intended to replace advice given to you by your health care provider. Make sure you discuss any questions you have with your health care provider. Document Released: 05/14/2004 Document Revised: 05/21/2016 Document Reviewed: 03/03/2016 Elsevier Interactive Patient Education  2017 [employer].

## 2016-12-28 LAB — COMPLETE METABOLIC PANEL WITH GFR
ALBUMIN: 4.5 g/dL (ref 3.6–5.1)
ALK PHOS: 63 U/L (ref 33–115)
ALT: 44 U/L — ABNORMAL HIGH (ref 6–29)
AST: 30 U/L (ref 10–30)
BUN: 11 mg/dL (ref 7–25)
CHLORIDE: 98 mmol/L (ref 98–110)
CO2: 25 mmol/L (ref 20–31)
Calcium: 10.4 mg/dL — ABNORMAL HIGH (ref 8.6–10.2)
Creat: 0.83 mg/dL (ref 0.50–1.10)
GFR, EST NON AFRICAN AMERICAN: 88 mL/min (ref >=60)
GFR, Est African American: >89 mL/min (ref >=60)
GLUCOSE: 128 mg/dL — AB (ref 65–99)
POTASSIUM: 4 mmol/L (ref 3.5–5.3)
SODIUM: 141 mmol/L (ref 135–146)
Total Bilirubin: 0.5 mg/dL (ref 0.2–1.2)
Total Protein: 7.5 g/dL (ref 6.1–8.1)

## 2016-12-28 LAB — MAGNESIUM: Magnesium: 1.9 mg/dL (ref 1.5–2.5)

## 2017-01-10 MED FILL — ZOLPIDEM TART ER 12.5 MG TA: 12.5 | 30 days supply | Qty: 30 | Fill #2

## 2017-01-20 ENCOUNTER — Other Ambulatory Visit: Payer: Self-pay | Admitting: Osteopathic Medicine

## 2017-01-20 DIAGNOSIS — F411 Generalized anxiety disorder: Secondary | ICD-10-CM

## 2017-01-20 MED FILL — CHLORTHALIDONE 25 MG TABLET: 25 | 30 days supply | Qty: 30 | Fill #1

## 2017-01-20 MED FILL — DULoxetine HCL 30 MG CPEP: 30 | 90 days supply | Qty: 90 | Fill #0

## 2017-01-26 ENCOUNTER — Other Ambulatory Visit: Payer: Self-pay

## 2017-01-26 MED ORDER — MONTELUKAST SODIUM 10 MG PO TABS: 10.0000 mg | ORAL_TABLET | Freq: Every day | ORAL | 3 refills | Status: DC

## 2017-01-26 NOTE — Telephone Encounter (Signed)
Refill request for Montelukast 10 mg.#90 3 refills sent to [facility]. Lavelle Berland,CMA

## 2017-02-09 MED FILL — ZOLPIDEM TART ER 12.5 MG TA: 12.5 | 30 days supply | Qty: 30 | Fill #3

## 2017-02-16 ENCOUNTER — Telehealth: Payer: Self-pay | Admitting: Osteopathic Medicine

## 2017-02-16 DIAGNOSIS — R748 Abnormal levels of other serum enzymes: Secondary | ICD-10-CM

## 2017-02-16 DIAGNOSIS — R7309 Other abnormal glucose: Secondary | ICD-10-CM | POA: Diagnosis not present

## 2017-02-16 DIAGNOSIS — D72829 Elevated white blood cell count, unspecified: Secondary | ICD-10-CM | POA: Diagnosis not present

## 2017-02-16 LAB — COMPLETE METABOLIC PANEL WITH GFR
ALT: 38 U/L — AB (ref 6–29)
AST: 24 U/L (ref 10–30)
Albumin: 4.6 g/dL (ref 3.6–5.1)
Alkaline Phosphatase: 60 U/L (ref 33–115)
BILIRUBIN TOTAL: 0.6 mg/dL (ref 0.2–1.2)
BUN: 13 mg/dL (ref 7–25)
CO2: 26 mmol/L (ref 20–31)
CREATININE: 0.84 mg/dL (ref 0.50–1.10)
Calcium: 10.3 mg/dL — ABNORMAL HIGH (ref 8.6–10.2)
Chloride: 98 mmol/L (ref 98–110)
GFR, Est Non African American: 87 mL/min (ref >=60)
GLUCOSE: 122 mg/dL — AB (ref 65–99)
Potassium: 3.9 mmol/L (ref 3.5–5.3)
Sodium: 142 mmol/L (ref 135–146)
TOTAL PROTEIN: 8.2 g/dL — AB (ref 6.1–8.1)

## 2017-02-16 LAB — CBC WITH DIFFERENTIAL/PLATELET
BASOS ABS: 0 {cells}/uL (ref 0–200)
Basophils Relative: 0 %
EOS ABS: 276 {cells}/uL (ref 15–500)
Eosinophils Relative: 3 %
HEMATOCRIT: 42.6 % (ref 35.0–45.0)
Hemoglobin: 13.5 g/dL (ref 11.7–15.5)
Lymphocytes Relative: 31 %
Lymphs Abs: 2852 cells/uL (ref 850–3900)
MCH: 26.7 pg — AB (ref 27.0–33.0)
MCHC: 31.7 g/dL — AB (ref 32.0–36.0)
MCV: 84.4 fL (ref 80.0–100.0)
MONOS PCT: 8 %
MPV: 11.1 fL (ref 7.5–12.5)
Monocytes Absolute: 736 cells/uL (ref 200–950)
NEUTROS PCT: 58 %
Neutro Abs: 5336 cells/uL (ref 1500–7800)
PLATELETS: 310 10*3/uL (ref 140–400)
RBC: 5.05 MIL/uL (ref 3.80–5.10)
RDW: 13.8 % (ref 11.0–15.0)
WBC: 9.2 10*3/uL (ref 3.8–10.8)

## 2017-02-16 NOTE — Telephone Encounter (Signed)
Here w husband Due for labs

## 2017-02-17 LAB — HEMOGLOBIN A1C
Hgb A1c MFr Bld: 5.5 % (ref <5.7)
Mean Plasma Glucose: 111 mg/dL

## 2017-02-21 MED FILL — METOPROLOL SUCC ER 100 MG T: 100 | 90 days supply | Qty: 90 | Fill #0

## 2017-02-21 MED FILL — CHLORTHALIDONE 25 MG TABLET: 25 | 90 days supply | Qty: 90 | Fill #0

## 2017-03-08 ENCOUNTER — Other Ambulatory Visit: Payer: Self-pay | Admitting: Osteopathic Medicine

## 2017-03-08 MED FILL — ZOLPIDEM TART ER 12.5 MG TA: 12.5 | 30 days supply | Qty: 30 | Fill #0

## 2017-03-23 DIAGNOSIS — Z1231 Encounter for screening mammogram for malignant neoplasm of breast: Secondary | ICD-10-CM | POA: Diagnosis not present

## 2017-03-28 ENCOUNTER — Ambulatory Visit (INDEPENDENT_AMBULATORY_CARE_PROVIDER_SITE_OTHER): Payer: 59 | Admitting: Osteopathic Medicine

## 2017-03-28 VITALS — BP 127/84 | HR 98 | Ht 62.0 in | Wt 194.0 lb

## 2017-03-28 DIAGNOSIS — G4700 Insomnia, unspecified: Secondary | ICD-10-CM

## 2017-03-28 MED ORDER — SUVOREXANT 15 MG PO TABS: 15.0000 mg | ORAL_TABLET | Freq: Every day | ORAL | 0 refills | Status: DC

## 2017-03-28 MED ORDER — SUVOREXANT 10 MG PO TABS
10.0000 mg | ORAL_TABLET | Freq: Every day | ORAL | 0 refills | Status: DC
Start: 2017-03-28 — End: 2017-06-08

## 2017-03-28 MED ORDER — SUVOREXANT 20 MG PO TABS: 20.0000 mg | ORAL_TABLET | Freq: Every day | ORAL | 0 refills | Status: DC

## 2017-03-28 NOTE — Progress Notes (Signed)
HPI: [name] [name] is a 41 y.o. female  who presents to [facility] today, 03/28/17,  for chief complaint of:  Chief Complaint  Patient presents with  . Follow-up    Ambien is not working    Insomnia - ambien not working as well as it used to. Has been on this for years. Was also on Lunesta in the past but this wasn't helpful. Sleep onset and maintenance insomnia. Melatonin hasn't been helpful.     Past medical history, surgical history, social history and family history reviewed.  Patient Active Problem List   Diagnosis Date Noted  . PVC (premature ventricular contraction) 12/27/2016  . Weight gain 07/19/2016  . Postoperative state 06/30/2015  . Impaired fasting glucose 03/03/2015  . Generalized anxiety disorder 05/02/2014  . Leukocytosis 01/21/2014  . Asthma, chronic 01/02/2014  . Insomnia 01/02/2014  . Essential hypertension, benign 01/02/2014  . Obesity 01/02/2014    Current medication list and allergy/intolerance information reviewed.   Current Outpatient Prescriptions on File Prior to Visit  Medication Sig Dispense Refill  . chlorthalidone (HYGROTON) 25 MG tablet Take 1 tablet (25 mg total) by mouth daily. 90 tablet 1  . DULoxetine (CYMBALTA) 30 MG capsule TAKE ONE CAPSULE BY MOUTH DAILY 90 capsule 0  . metoprolol succinate (TOPROL-XL) 100 MG 24 hr tablet Take 1 tablet (100 mg total) by mouth daily. Take with or immediately following a meal. 90 tablet 3  . montelukast (SINGULAIR) 10 MG tablet Take 1 tablet (10 mg total) by mouth at bedtime. 90 tablet 3  . zolpidem (AMBIEN CR) 12.5 MG CR tablet Take 1 tablet (12.5 mg total) by mouth at bedtime as needed. for sleep APPOINTMENT NEEDED FOR FURTHER REFILLS 30 tablet 0   No current facility-administered medications on file prior to visit.    No Known Allergies    Review of Systems:  Constitutional: No recent illness, feels well today   Cardiac: No  chest pain, No   pressure  Respiratory:  No  shortness of breath, no apnea/snoring  Neurologic: No  weakness, No  Dizziness  Psychiatric: No  concerns with depression, +concerns with anxiety  Exam:  BP 127/84 (BP Location: Left Arm, Patient Position: Sitting, Cuff Size: Normal)   Pulse 98   Ht 5\' 2"  (1.575 m)   Wt 194 lb (88 kg)   BMI 35.48 kg/m   Constitutional: VS see above. General Appearance: alert, well-developed, well-nourished, NAD  Eyes: Normal lids and conjunctive, non-icteric sclera  Ears, Nose, Mouth, Throat: MMM, Normal external inspection ears/nares/mouth/lips/gums.  Neck: No masses, trachea midline.   Respiratory: Normal respiratory effort.  Musculoskeletal: Gait normal. Symmetric and independent movement of all extremities  Neurological: Normal balance/coordination. No tremor.  Skin: warm, dry, intact.   Psychiatric: Normal judgment/insight. Normal mood and affect. Oriented x3.      ASSESSMENT/PLAN:   Trial sow titration on Belsomra, advised may not notice effect for some time but stick with it. May require prior auth process.   Consider TCA  Consider sleep study/neuro referral if no better  Insomnia, unspecified type - Plan: Suvorexant (BELSOMRA) 10 MG TABS, Suvorexant (BELSOMRA) 15 MG TABS, Suvorexant (BELSOMRA) 20 MG TABS      Follow-up plan: Return in about 4 weeks (around 04/25/2017) for follow-up insomnia if needed .  Visit summary with medication list and pertinent instructions was printed for patient to review, alert [place] if any changes needed. All questions at time of visit were answered - patient instructed to contact  office with any additional concerns. ER/RTC precautions were reviewed with the patient and understanding verbalized.

## 2017-03-29 ENCOUNTER — Telehealth: Payer: Self-pay

## 2017-03-29 NOTE — Telephone Encounter (Signed)
Medication change form placed in your basket for patient, he insurance is requiring her to have tried at least Ambien, [place], or  Lunesta. I spoke to patient and she stated that she has not tried any of these medications listed. Please advise. The form is in your box. [name] [name],CMA

## 2017-03-29 NOTE — Telephone Encounter (Signed)
Patient has been on [place] - see progress note

## 2017-03-29 NOTE — Telephone Encounter (Signed)
Pre Authorization sent to cover my meds.Key: [name] [name]

## 2017-04-01 MED FILL — BELSOMRA 10 MG TABLET: 10 | 10 days supply | Qty: 10 | Fill #0

## 2017-04-04 ENCOUNTER — Telehealth: Payer: Self-pay | Admitting: Osteopathic Medicine

## 2017-04-04 NOTE — Telephone Encounter (Signed)
Received fax from [place] and they approved Belsomra 10 mg for a maximum of 12 fills from 03/31/17 - 03/30/18 as long as patient is enrolled as a member of current health plan.   PA reference number: [place]

## 2017-04-06 ENCOUNTER — Telehealth: Payer: Self-pay

## 2017-04-06 NOTE — Telephone Encounter (Signed)
Pt left VM stating that she would like a refill on her original Ambien rx and would not like to switch to alternative. Please advise.

## 2017-04-06 NOTE — Telephone Encounter (Signed)
OK to refill the Ambien but would just like to confirm whether she was able to fill the prescriptions to at least try the [place], or was there an obstacle to doing so (insurance coverage, etc.)?

## 2017-04-07 ENCOUNTER — Encounter: Payer: Self-pay | Admitting: Osteopathic Medicine

## 2017-04-07 ENCOUNTER — Other Ambulatory Visit: Payer: Self-pay

## 2017-04-07 MED ORDER — ZOLPIDEM TARTRATE ER 12.5 MG PO TBCR
12.5000 mg | EXTENDED_RELEASE_TABLET | Freq: Every evening | ORAL | 0 refills | Status: DC | PRN
Start: 2017-04-07 — End: 2017-04-28

## 2017-04-07 MED FILL — ZOLPIDEM TART ER 12.5 MG TA: 12.5 | 30 days supply | Qty: 30 | Fill #0

## 2017-04-08 NOTE — Telephone Encounter (Signed)
Pt states she tried to [place] for 2 days and didn't like it. Would like to stay on ambien at this time.

## 2017-04-28 ENCOUNTER — Other Ambulatory Visit: Payer: Self-pay | Admitting: Osteopathic Medicine

## 2017-04-28 ENCOUNTER — Other Ambulatory Visit: Payer: Self-pay | Admitting: *Deleted

## 2017-04-28 DIAGNOSIS — F411 Generalized anxiety disorder: Secondary | ICD-10-CM

## 2017-04-28 MED ORDER — ZOLPIDEM TARTRATE ER 12.5 MG PO TBCR: 12.5000 mg | EXTENDED_RELEASE_TABLET | Freq: Every evening | ORAL | 3 refills | Status: DC | PRN

## 2017-04-28 MED FILL — DULoxetine HCL 30 MG CPEP: 30 | 30 days supply | Qty: 30 | Fill #0

## 2017-04-29 ENCOUNTER — Encounter: Payer: Self-pay | Admitting: Osteopathic Medicine

## 2017-05-02 ENCOUNTER — Encounter: Payer: Self-pay | Admitting: Osteopathic Medicine

## 2017-05-05 MED FILL — ZOLPIDEM TART ER 12.5 MG TA: 12.5 | 30 days supply | Qty: 30 | Fill #0

## 2017-05-24 ENCOUNTER — Other Ambulatory Visit: Payer: Self-pay | Admitting: Osteopathic Medicine

## 2017-05-24 DIAGNOSIS — F411 Generalized anxiety disorder: Secondary | ICD-10-CM

## 2017-05-24 MED FILL — DULoxetine HCL 30 MG CPEP: 30 | 15 days supply | Qty: 15 | Fill #0

## 2017-05-24 MED FILL — CHLORTHALIDONE 25 MG TABLET: 25 | 90 days supply | Qty: 90 | Fill #1 | Status: TO

## 2017-05-24 MED FILL — METOPROLOL SUCC ER 100 MG T: 100 | 90 days supply | Qty: 90 | Fill #1 | Status: TO

## 2017-06-02 MED FILL — ZOLPIDEM TART ER 12.5 MG TA: 12.5 | 30 days supply | Qty: 30 | Fill #1 | Status: TO

## 2017-06-08 ENCOUNTER — Ambulatory Visit (INDEPENDENT_AMBULATORY_CARE_PROVIDER_SITE_OTHER): Payer: 59 | Admitting: Osteopathic Medicine

## 2017-06-08 ENCOUNTER — Encounter: Payer: Self-pay | Admitting: Osteopathic Medicine

## 2017-06-08 VITALS — BP 129/83 | HR 89 | Ht 62.0 in | Wt 193.0 lb

## 2017-06-08 DIAGNOSIS — Z23 Encounter for immunization: Secondary | ICD-10-CM

## 2017-06-08 DIAGNOSIS — F5101 Primary insomnia: Secondary | ICD-10-CM

## 2017-06-08 DIAGNOSIS — K649 Unspecified hemorrhoids: Secondary | ICD-10-CM

## 2017-06-08 MED ORDER — DOCUSATE SODIUM 100 MG PO CAPS: 100.0000 mg | ORAL_CAPSULE | Freq: Two times a day (BID) | ORAL | 3 refills | Status: AC

## 2017-06-08 MED ORDER — HYDROCORTISONE ACETATE 25 MG RE SUPP: 25.0000 mg | Freq: Two times a day (BID) | RECTAL | 2 refills | Status: AC | PRN

## 2017-06-08 MED ORDER — MONTELUKAST SODIUM 10 MG PO TABS: 10.0000 mg | ORAL_TABLET | Freq: Every day | ORAL | 3 refills | Status: AC

## 2017-06-08 MED FILL — ANUSOL-HC 25 MG SUPPOSITORY: 25 | 12 days supply | Qty: 24 | Fill #0

## 2017-06-08 MED FILL — MONTELUKAST SOD 10 MG TAB: 10 | 90 days supply | Qty: 90 | Fill #0

## 2017-06-08 NOTE — Progress Notes (Signed)
HPI: [name] [name] is a 41 y.o. female  who presents to [facility] today, 06/08/17,  for chief complaint of:  Chief Complaint  Patient presents with  . Rectal Bleeding    Blood on toilet tissue and BRB around normal appearing stool x4 occasional, nonpainful, no constipation, no hx hemorrhoids she knows of. No  abdominal pain, No  nausea, No  vomiting,  +blood in stool but no black/tarry stool, No  diarrhea, +mild constipation - stool is fairly soft but she is going less frequently. No FH colon cancer in 1st deg relative or multiple distant relatives.    Would like referral to neurology - sleep specialist for insomnia. We have tried to wean off the Ambien or switch to other meds which hasn't helped, she would like to be off these medications eventually.    Past medical, surgical, social and family history reviewed: Patient Active Problem List   Diagnosis Date Noted  . PVC (premature ventricular contraction) 12/27/2016  . Weight gain 07/19/2016  . Postoperative state 06/30/2015  . Impaired fasting glucose 03/03/2015  . Generalized anxiety disorder 05/02/2014  . Leukocytosis 01/21/2014  . Asthma, chronic 01/02/2014  . Insomnia 01/02/2014  . Essential hypertension, benign 01/02/2014  . Obesity 01/02/2014   Past Surgical History:  Procedure Laterality Date  . BREAST REDUCTION SURGERY  2001  . CHOLECYSTECTOMY    . gallbladder removed  2001  . lipo suction  2012  . TUBAL LIGATION  2010   Social History  Substance Use Topics  . Smoking status: Never Smoker  . Smokeless tobacco: Never Used  . Alcohol use No   Family History  Problem Relation Age of Onset  . Hypertension Mother   . Hypertension Maternal Grandmother   . Other Sister        Deceaesd, 40 from blood clot  . Bipolar disorder Sister   . Healthy Son        x2  . Healthy Daughter        x1     Current medication list and allergy/intolerance information reviewed:    Current Outpatient Prescriptions  Medication Sig Dispense Refill  . chlorthalidone (HYGROTON) 25 MG tablet Take 1 tablet (25 mg total) by mouth daily. 90 tablet 1  . DULoxetine (CYMBALTA) 30 MG capsule TAKE 1 CAPSULE (30 MG TOTAL) BY MOUTH DAILY. APPOINTMENT NEEDED FOR FURTHER REFILLS 15 capsule 0  . metoprolol succinate (TOPROL-XL) 100 MG 24 hr tablet Take 1 tablet (100 mg total) by mouth daily. Take with or immediately following a meal. 90 tablet 3  . montelukast (SINGULAIR) 10 MG tablet Take 1 tablet (10 mg total) by mouth at bedtime. 90 tablet 3  . zolpidem (AMBIEN CR) 12.5 MG CR tablet Take 1 tablet (12.5 mg total) by mouth at bedtime as needed. 30 tablet 3   No current facility-administered medications for this visit.    No Known Allergies    Review of Systems:  Cardiac: No  chest pain  Respiratory:  No  shortness of breath.   Gastrointestinal: No  abdominal pain, No  nausea, No  vomiting,  +blood in stool but no black/tarry stool, No  diarrhea, +mild constipation - stool is fairly soft but she is going less frequently   Skin: No  Rash,   Hem/Onc: No  easy bruising/bleeding, No  abnormal lymph node  Psychiatric: No  concerns with depression, No  concerns with anxiety, No sleep problems, No mood problems  Exam:  BP 129/83  Pulse 89   Ht 5\' 2"  (1.575 m)   Wt 193 lb (87.5 kg)   BMI 35.30 kg/m   Constitutional: VS see above. General Appearance: alert, well-developed, well-nourished, NAD  Eyes: Normal lids and conjunctive, non-icteric sclera  Ears, Nose, Mouth, Throat: MMM, Normal external inspection ears/nares/mouth/lips/gums.   Neck: No masses, trachea midline.   Respiratory: Normal respiratory effort.  Gastrointestinal: Nontender, no masses. . No hernia appreciated. Rectal exam normal tone, hemorrhoidal skin tags externally but no fissue or external active hemorrhoids, no stool on glove, smooth nonpainful protrusion anterior rectum c/w possible internal hemorrhoid,  nonpainful .   Musculoskeletal: Gait normal. No clubbing/cyanosis of digits.   Neurological: Normal balance/coordination. No tremor.   Skin: warm, dry, intact. No rash/ulcer. No concerning nevi or subq nodules on limited exam.    Psychiatric: Normal judgment/insight. Normal mood and affect. Oriented x3.     ASSESSMENT/PLAN:   Hemorrhoids, unspecified hemorrhoid type - see printed instructions. Alleviate constipation, meds as needed if pain develps, can consult w/ GI or Gen Surg if persist/worse - Plan: hydrocortisone (ANUSOL-HC) 25 MG suppository, docusate sodium (COLACE) 100 MG capsule  Need for immunization against influenza - Plan: Flu Vaccine QUAD 36+ mos IM  Primary insomnia - Plan: Ambulatory referral to Neurology     Visit summary with medication list and pertinent instructions was printed for patient to review. All questions at time of visit were answered - patient instructed to contact office with any additional concerns. ER/RTC precautions were reviewed with the patient. Follow-up plan: Return if symptoms worsen or fail to improve.  Note: Total time spent 25 minutes, greater than 50% of the visit was spent face-to-face counseling and coordinating care for the following: The primary encounter diagnosis was Hemorrhoids, unspecified hemorrhoid type. Diagnoses of Need for immunization against influenza and Primary insomnia were also pertinent to this visit.Marland Kitchen

## 2017-06-08 NOTE — Patient Instructions (Signed)
Hemorrhoids Hemorrhoids are swollen veins in and around the rectum or anus. There are two types of hemorrhoids:  Internal hemorrhoids. These occur in the veins that are just inside the rectum. They may poke through to the outside and become irritated and painful.  External hemorrhoids. These occur in the veins that are outside of the anus and can be felt as a painful swelling or hard lump near the anus.  Most hemorrhoids do not cause serious problems, and they can be managed with home treatments such as diet and lifestyle changes. If home treatments do not help your symptoms, procedures can be done to shrink or remove the hemorrhoids. What are the causes? This condition is caused by increased pressure in the anal area. This pressure may result from various things, including:  Constipation.  Straining to have a bowel movement.  Diarrhea.  Pregnancy.  Obesity.  Sitting for long periods of time.  Heavy lifting or other activity that causes you to strain.  Anal sex.  What are the signs or symptoms? Symptoms of this condition include:  Pain.  Anal itching or irritation.  Rectal bleeding.  Leakage of stool (feces).  Anal swelling.  One or more lumps around the anus.  How is this diagnosed? This condition can often be diagnosed through a visual exam. Other exams or tests may also be done, such as:  Examination of the rectal area with a gloved hand (digital rectal exam).  Examination of the anal canal using a small tube (anoscope).  A blood test, if you have lost a significant amount of blood.  A test to look inside the colon (sigmoidoscopy or colonoscopy).  How is this treated? This condition can usually be treated at home. However, various procedures may be done if dietary changes, lifestyle changes, and other home treatments do not help your symptoms. These procedures can help make the hemorrhoids smaller or remove them completely. Some of these procedures involve  surgery, and others do not. Common procedures include:  Rubber band ligation. Rubber bands are placed at the base of the hemorrhoids to cut off the blood supply to them.  Sclerotherapy. Medicine is injected into the hemorrhoids to shrink them.  Infrared coagulation. A type of light energy is used to get rid of the hemorrhoids.  Hemorrhoidectomy surgery. The hemorrhoids are surgically removed, and the veins that supply them are tied off.  Stapled hemorrhoidopexy surgery. A circular stapling device is used to remove the hemorrhoids and use staples to cut off the blood supply to them.  Follow these instructions at home: Eating and drinking  Eat foods that have a lot of fiber in them, such as whole grains, beans, nuts, fruits, and vegetables. Ask your health care provider about taking products that have added fiber (fiber supplements).  Drink enough fluid to keep your urine clear or pale yellow. Managing pain and swelling  Take warm sitz baths for 20 minutes, 3-4 times a day to ease pain and discomfort.  If directed, apply ice to the affected area. Using ice packs between sitz baths may be helpful. ? Put ice in a plastic bag. ? Place a towel between your skin and the bag. ? Leave the ice on for 20 minutes, 2-3 times a day. General instructions  Take over-the-counter and prescription medicines only as told by your health care provider.  Use medicated creams or suppositories as told.  Exercise regularly.  Go to the bathroom when you have the urge to have a bowel movement. Do not wait.    Avoid straining to have bowel movements.  Keep the anal area dry and clean. Use wet toilet paper or moist towelettes after a bowel movement.  Do not sit on the toilet for long periods of time. This increases blood pooling and pain. Contact a health care provider if:  You have increasing pain and swelling that are not controlled by treatment or medicine.  You have uncontrolled bleeding.  You  have difficulty having a bowel movement, or you are unable to have a bowel movement.  You have pain or inflammation outside the area of the hemorrhoids. This information is not intended to replace advice given to you by your health care provider. Make sure you discuss any questions you have with your health care provider. Document Released: 09/24/2000 Document Revised: 02/25/2016 Document Reviewed: 06/11/2015 Elsevier Interactive Patient Education  2017 Elsevier Inc.  

## 2017-06-30 ENCOUNTER — Other Ambulatory Visit: Payer: Self-pay | Admitting: Osteopathic Medicine

## 2017-06-30 DIAGNOSIS — F411 Generalized anxiety disorder: Secondary | ICD-10-CM

## 2017-07-04 MED ORDER — DULOXETINE HCL 30 MG PO CPEP: 30.0000 mg | ORAL_CAPSULE | Freq: Every day | ORAL | 0 refills | Status: DC

## 2017-07-04 MED FILL — ZOLPIDEM TART ER 12.5 MG TA: 12.5 | 30 days supply | Qty: 30 | Fill #0

## 2017-07-11 ENCOUNTER — Ambulatory Visit (INDEPENDENT_AMBULATORY_CARE_PROVIDER_SITE_OTHER): Payer: 59 | Admitting: Neurology

## 2017-07-11 ENCOUNTER — Encounter: Payer: Self-pay | Admitting: Neurology

## 2017-07-11 VITALS — BP 128/88 | HR 87 | Ht 62.0 in | Wt 191.0 lb

## 2017-07-11 DIAGNOSIS — R351 Nocturia: Secondary | ICD-10-CM | POA: Diagnosis not present

## 2017-07-11 DIAGNOSIS — G2581 Restless legs syndrome: Secondary | ICD-10-CM | POA: Diagnosis not present

## 2017-07-11 DIAGNOSIS — E669 Obesity, unspecified: Secondary | ICD-10-CM

## 2017-07-11 DIAGNOSIS — R51 Headache: Secondary | ICD-10-CM | POA: Diagnosis not present

## 2017-07-11 DIAGNOSIS — R519 Headache, unspecified: Secondary | ICD-10-CM

## 2017-07-11 DIAGNOSIS — G478 Other sleep disorders: Secondary | ICD-10-CM

## 2017-07-11 DIAGNOSIS — R0683 Snoring: Secondary | ICD-10-CM | POA: Diagnosis not present

## 2017-07-11 NOTE — Progress Notes (Signed)
Subjective:    Patient ID: [name] [name] is a 41 y.o. female.  HPI     Star Age, MD, PhD [facility] [address] [address] [place], [place] [address]  Dear Dr. Sheppard Coil,   I saw your patient, [name] [name], upon your kind request in my neurologic clinic today for initial consultation of her sleep disturbance, in particular, difficulty with her sleep onset and sleep maintenance as well as snoring reported. The patient is unaccompanied today. As you know, [name] [name] is a 41 year old right-handed woman with an underlying medical history of PVCs, anxiety, asthma, hypertension, impaired fasting glucose and obesity, who reports a long-standing history of several years of sleep maintenance and sleep onset difficulties. Her husband reports that she snores. She has woken herself up with snoring. She has been on Ambien for years. She currently takes Ambien CR. Years ago she was on regular Ambien, she tried [place] for about 4 days which caused her to stay awake more. She does report that her husband snores loudly and that disturbs her. The time is around 10 and wake up time around 5:30. I reviewed your office note from 03/28/2017. She has been on Ambien for years. She tried melatonin in the past which did not help, as well as Lunesta, which did not help. Her Epworth sleepiness score is 2 out of 24, fatigue score is 54 out of 63. She is married and lives with her husband and her 2 younger children, 2 boys, she also has a 32 year old daughter. She doesn't currently work. She is a nonsmoker and drinks alcohol occasionally, caffeine in the form of coffee, usually one cup per day. She also may drink 1 soda or tea. She does take a late morning nap typically. She has had occasional morning headaches. She has nocturia up to 3 times per average night. She has no family history of OSA. She has occasional restless leg symptoms. She feels like she has a discomfort in both  distal legs and has to shake or move her legs.  Her Past Medical History Is Significant For: Past Medical History:  Diagnosis Date  . Anxiety   . Asthma, chronic 01/02/2014  . Essential hypertension, benign 01/02/2014  . Insomnia   . Obesity 01/02/2014    Her Past Surgical History Is Significant For: Past Surgical History:  Procedure Laterality Date  . BREAST REDUCTION SURGERY  2001  . CHOLECYSTECTOMY    . gallbladder removed  2001  . lipo suction  2012  . TUBAL LIGATION  2010    Her Family History Is Significant For: Family History  Problem Relation Age of Onset  . Hypertension Mother   . Hypertension Maternal Grandmother   . Other Sister        Deceaesd, 30 from blood clot  . Bipolar disorder Sister   . Healthy Son        x2  . Healthy Daughter        x1    Her Social History Is Significant For: Social History   Social History  . Marital status: Married    Spouse name: N/A  . Number of children: N/A  . Years of education: N/A   Social History Main Topics  . Smoking status: Never Smoker  . Smokeless tobacco: Never Used  . Alcohol use No  . Drug use: No  . Sexual activity: Yes    Partners: Male   Other Topics Concern  . None   Social History Narrative  She live with husband and three children.   She works part-time at [employer] level of education:  Some college    Her Allergies Are:  No Known Allergies:   Her Current Medications Are:  Outpatient Encounter Prescriptions as of 07/11/2017  Medication Sig  . chlorthalidone (HYGROTON) 25 MG tablet Take 1 tablet (25 mg total) by mouth daily.  Marland Kitchen docusate sodium (COLACE) 100 MG capsule Take 1 capsule (100 mg total) by mouth 2 (two) times daily. As needed to soften stool  . DULoxetine (CYMBALTA) 30 MG capsule Take 1 capsule (30 mg total) by mouth daily. APPOINTMENT NEEDED FOR FURTHER REFILLS  . hydrocortisone (ANUSOL-HC) 25 MG suppository Place 1 suppository (25 mg total) rectally 2 (two)  times daily as needed for hemorrhoids.  . metoprolol succinate (TOPROL-XL) 100 MG 24 hr tablet Take 1 tablet (100 mg total) by mouth daily. Take with or immediately following a meal.  . montelukast (SINGULAIR) 10 MG tablet Take 1 tablet (10 mg total) by mouth at bedtime.  Marland Kitchen zolpidem (AMBIEN CR) 12.5 MG CR tablet Take 1 tablet (12.5 mg total) by mouth at bedtime as needed.   No facility-administered encounter medications on file as of 07/11/2017.   :  Review of Systems:  Out of a complete 14 point review of systems, all are reviewed and negative with the exception of these symptoms as listed below: Review of Systems  Neurological:       Pt presents today to discuss her sleep. Pt needs ambien to go to sleep and lately, pt reports the Lorrin Mais has not been as effective. Pt has never had a sleep study but does endorse snoring.  Epworth Sleepiness Scale 0= would never doze 1= slight chance of dozing 2= moderate chance of dozing 3= high chance of dozing  Sitting and reading: 0 Watching TV: 0 Sitting inactive in a public place (ex. Theater or meeting): 0 As a passenger in a car for an hour without a break: 0 Lying down to rest in the afternoon: 2 Sitting and talking to someone: 0 Sitting quietly after lunch (no alcohol): 0 In a car, while stopped in traffic: 0 Total: 2     Objective:  Neurological Exam  Physical Exam Physical Examination:   Vitals:   07/11/17 1116  BP: 128/88  Pulse: 87    General Examination: The patient is a very pleasant 41 y.o. female in no acute distress. She appears well-developed and well-nourished and well groomed.   HEENT: Normocephalic, atraumatic, pupils are equal, round and reactive to light and accommodation. Extraocular tracking is good without limitation to gaze excursion or nystagmus noted. Normal smooth pursuit is noted. Hearing is grossly intact. Face is symmetric with normal facial animation and normal facial sensation. Speech is clear with no  dysarthria noted. There is no hypophonia. There is no lip, neck/head, jaw or voice tremor. Neck is supple with full range of passive and active motion. There are no carotid bruits on auscultation. Oropharynx exam reveals: mild mouth dryness, adequate dental hygiene and moderate airway crowding, due to smaller airway entry, tonsils are 1-2+. Mallampati is class II. Tongue protrudes centrally and palate elevates symmetrically. Neck size is 16 inches.   Chest: Clear to auscultation without wheezing, rhonchi or crackles noted.  Heart: S1+S2+0, regular and normal without murmurs, rubs or gallops noted.   Abdomen: Soft, non-tender and non-distended with normal bowel sounds appreciated on auscultation.  Extremities: There is no pitting edema in the distal lower extremities  bilaterally. Pedal pulses are intact.  Skin: Warm and dry without trophic changes noted.  Musculoskeletal: exam reveals no obvious joint deformities, tenderness or joint swelling or erythema.   Neurologically:  Mental status: The patient is awake, alert and oriented in all 4 spheres. Her immediate and remote memory, attention, language skills and fund of knowledge are appropriate. There is no evidence of aphasia, agnosia, apraxia or anomia. Speech is clear with normal prosody and enunciation. Thought process is linear. Mood is normal and affect is normal.  Cranial nerves II - XII are as described above under HEENT exam. In addition: shoulder shrug is normal with equal shoulder height noted. Motor exam: Normal bulk, strength and tone is noted. There is no drift, tremor or rebound. Romberg is negative. Reflexes are 2+ throughout. Fine motor skills and coordination: grossly intact.  Cerebellar testing: No dysmetria or intention tremor on finger to nose testing. Heel to shin is unremarkable bilaterally. There is no truncal or gait ataxia.  Sensory exam: intact to light touch in the upper and lower extremities.  Gait, station and balance:  She stands easily. No veering to one side is noted. No leaning to one side is noted. Posture is age-appropriate and stance is narrow based. Gait shows normal stride length and normal pace. No problems turning are noted.  Assessment and Plan:   In summary, [name] [name] is a very pleasant 41 y.o.-year old female with an underlying medical history of PVCs, anxiety, asthma, hypertension, impaired fasting glucose and obesity, whose history and physical exam are concerning for obstructive sleep apnea (OSA). She also endorses intermittent restless leg symptoms. Sleep study testing is justified to rule out an organic cause for sleep disturbance. I had a long chat with the patient about my findings and the diagnosis of OSA, its prognosis and treatment options. We talked about medical treatments, surgical interventions and non-pharmacological approaches. I explained in particular the risks and ramifications of untreated moderate to severe OSA, especially with respect to developing cardiovascular disease down the Road, including congestive heart failure, difficult to treat hypertension, cardiac arrhythmias, or stroke. Even type 2 diabetes has, in part, been linked to untreated OSA. Symptoms of untreated OSA include daytime sleepiness, memory problems, mood irritability and mood disorder such as depression and anxiety, lack of energy, as well as recurrent headaches, especially morning headaches. We talked about trying to maintain a healthy lifestyle in general, as well as the importance of weight control. I encouraged the patient to eat healthy, exercise daily and keep well hydrated, to keep a scheduled bedtime and wake time routine, to not skip any meals and eat healthy snacks in between meals. I advised the patient not to drive when feeling sleepy. I recommended the following at this time: sleep study with potential positive airway pressure titration. (We will score hypopneas at 3%).   I explained the sleep test  procedure to the patient and also outlined possible surgical and non-surgical treatment options of OSA, including the use of a custom-made dental device (which would require a referral to a specialist dentist or oral surgeon), upper airway surgical options, such as pillar implants, radiofrequency surgery, tongue base surgery, and UPPP (which would involve a referral to an ENT surgeon). Rarely, jaw surgery such as mandibular advancement may be considered.  I also explained the CPAP treatment option to the patient, who indicated that she would be willing to try CPAP if the need arises. I explained the importance of being compliant with PAP treatment, not only for  insurance purposes but primarily to improve Her symptoms, and for the patient's long term health benefit, including to reduce Her cardiovascular risks. I answered all her questions today and the patient was in agreement. I would like to see her back after the sleep study is completed and encouraged her to call with any interim questions, concerns, problems or updates.   Thank you very much for allowing me to participate in the care of this nice patient. If I can be of any further assistance to you please do not hesitate to call me at [phone].  Sincerely,   Star Age, MD, PhD

## 2017-07-11 NOTE — Patient Instructions (Addendum)
Based on your symptoms and your exam I believe we should look for an underlying organic sleep disorder, such as obstructive sleep apnea/OSA, or dream enactments, or periodic leg movement disorder (PLMD), which is leg twitching in sleep. While this is typically associated with symptoms of restless leg syndrome, patients can have periodic leg movements, that is, repetitive twitching in their sleep secondary to medication effects, particularly from taking an SSRI type antidepressant. Sometimes changing the antidepressant regimen can help. If this leg movement disorder disrupts your sleep, it can cause other problems such as daytime sleepiness. Please do not drive if you feel sleepy. Sometimes other conditions can cause leg twitching at night including thyroid dysfunction and iron deficiency. There are some symptomatic medications available for treatment.   At this point, we should proceed with a sleep study to further delineate your sleep-related issues.   If you have more than mild OSA, I want you to consider treatment with CPAP. Please remember, the risks and ramifications of moderate to severe obstructive sleep apnea or OSA are: Cardiovascular disease, including congestive heart failure, stroke, difficult to control hypertension, arrhythmias, and even type 2 diabetes has been linked to untreated OSA. Sleep apnea causes disruption of sleep and sleep deprivation in most cases, which, in turn, can cause recurrent headaches, problems with memory, mood, concentration, focus, and vigilance. Most people with untreated sleep apnea report excessive daytime sleepiness, which can affect their ability to drive. Please do not drive if you feel sleepy.   I will see you back after your sleep study to go over the test results and where to go from there. We will call you after your sleep study to advise about the results (most likely, you will hear from Kristen, my nurse) and to set up an appointment at the time.    Our  sleep lab administrative assistant, Dawn will call you to schedule your sleep study. If you don't hear back from her by about 2 weeks from now, please feel free to call her at [phone]. This is her direct line and please leave a message with your phone number to call back if you get the voicemail box. She will call back as soon as possible.     

## 2017-07-21 ENCOUNTER — Ambulatory Visit: Payer: Self-pay | Admitting: Osteopathic Medicine

## 2017-07-28 ENCOUNTER — Encounter: Payer: Self-pay | Admitting: Osteopathic Medicine

## 2017-07-28 ENCOUNTER — Ambulatory Visit (INDEPENDENT_AMBULATORY_CARE_PROVIDER_SITE_OTHER): Payer: 59 | Admitting: Osteopathic Medicine

## 2017-07-28 VITALS — BP 125/85 | HR 73 | Ht 62.0 in | Wt 191.0 lb

## 2017-07-28 DIAGNOSIS — F5101 Primary insomnia: Secondary | ICD-10-CM

## 2017-07-28 DIAGNOSIS — F411 Generalized anxiety disorder: Secondary | ICD-10-CM | POA: Diagnosis not present

## 2017-07-28 MED ORDER — ZOLPIDEM TARTRATE ER 12.5 MG PO TBCR: 12.5000 mg | EXTENDED_RELEASE_TABLET | Freq: Every evening | ORAL | 3 refills | Status: DC | PRN

## 2017-07-28 MED ORDER — DULOXETINE HCL 60 MG PO CPEP: 60.0000 mg | ORAL_CAPSULE | Freq: Every day | ORAL | 1 refills | Status: DC

## 2017-07-28 MED FILL — DULoxetine HCL 60 MG CPEP: 60 | 90 days supply | Qty: 90 | Fill #0

## 2017-07-28 NOTE — Progress Notes (Signed)
HPI: [name] [name] is a 41 y.o. female  who presents to [place] today, 07/28/17,  for chief complaint of:  Chief Complaint  Patient presents with  . Follow-up    Discuss increasing Cymbalta    Struggling with more anxiety and stress lately. Some family issues have been a problem. Requests increasing dose of Cymbalta. No thoughts of hurting self or others.    Past medical history, surgical history, social history and family history reviewed.  Patient Active Problem List   Diagnosis Date Noted  . PVC (premature ventricular contraction) 12/27/2016  . Weight gain 07/19/2016  . Postoperative state 06/30/2015  . Impaired fasting glucose 03/03/2015  . Generalized anxiety disorder 05/02/2014  . Leukocytosis 01/21/2014  . Asthma, chronic 01/02/2014  . Insomnia 01/02/2014  . Essential hypertension, benign 01/02/2014  . Obesity 01/02/2014    Current medication list and allergy/intolerance information reviewed.   Current Outpatient Prescriptions on File Prior to Visit  Medication Sig Dispense Refill  . chlorthalidone (HYGROTON) 25 MG tablet Take 1 tablet (25 mg total) by mouth daily. 90 tablet 1  . docusate sodium (COLACE) 100 MG capsule Take 1 capsule (100 mg total) by mouth 2 (two) times daily. As needed to soften stool 15 capsule 3  . DULoxetine (CYMBALTA) 30 MG capsule Take 1 capsule (30 mg total) by mouth daily. APPOINTMENT NEEDED FOR FURTHER REFILLS 7 capsule 0  . hydrocortisone (ANUSOL-HC) 25 MG suppository Place 1 suppository (25 mg total) rectally 2 (two) times daily as needed for hemorrhoids. 24 suppository 2  . metoprolol succinate (TOPROL-XL) 100 MG 24 hr tablet Take 1 tablet (100 mg total) by mouth daily. Take with or immediately following a meal. 90 tablet 3  . montelukast (SINGULAIR) 10 MG tablet Take 1 tablet (10 mg total) by mouth at bedtime. 90 tablet 3  . zolpidem (AMBIEN CR) 12.5 MG CR tablet Take 1 tablet (12.5 mg total) by  mouth at bedtime as needed. 30 tablet 3   No current facility-administered medications on file prior to visit.    No Known Allergies    Review of Systems:  HEENT: No  headache, no vision change  Cardiac: No  chest pain, No  pressure  Respiratory:  No  shortness of breath. No  Cough  Neurologic: No  weakness, No  Dizziness  Psychiatric: +concerns with depression, +concerns with anxiety  Exam:  BP 125/85   Pulse 73   Ht 5\' 2"  (1.575 m)   Wt 191 lb (86.6 kg)   BMI 34.93 kg/m   Constitutional: VS see above. General Appearance: alert, well-developed, well-nourished, NAD  Eyes: Normal lids and conjunctive, non-icteric sclera  Ears, Nose, Mouth, Throat: MMM, Normal external inspection ears/nares/mouth/lips/gums.  Neck: No masses, trachea midline.   Respiratory: Normal respiratory effort. no wheeze, no rhonchi, no rales  Cardiovascular: S1/S2 normal, no murmur, no rub/gallop auscultated. RRR.   Musculoskeletal: Gait normal. Symmetric and independent movement of all extremities  Neurological: Normal balance/coordination. No tremor.  Skin: warm, dry, intact.   Psychiatric: Normal judgment/insight. Normal mood and affect. Oriented x3.    No flowsheet data found.  Depression screen [facility] 2/9 07/28/2017 06/08/2017 07/19/2016  Decreased Interest 2 0 2  Down, Depressed, Hopeless 2 1 1   PHQ - 2 Score 4 1 3   Altered sleeping 3 3 2   Tired, decreased energy 2 2 1   Change in appetite 2 0 0  Feeling bad or failure about yourself  2 1 2   Trouble concentrating 0  0 0  Moving slowly or fidgety/restless 0 0 -  Suicidal thoughts 0 0 -  PHQ-9 Score 13 7 8   Difficult doing work/chores Somewhat difficult - Not difficult at all      ASSESSMENT/PLAN:   She is open to referral for counseling. We can go ahead and increase the Cymbalta. Trial 4-6 weeks and if doing well can refill, if still struggling asked patient to schedule follow-up with me to discuss augmentation of medicines, or  alternative   Generalized anxiety disorder - Plan: Ambulatory referral to Psychiatry, DULoxetine (CYMBALTA) 60 MG capsule  Primary insomnia - Records reviewed from neurology. Planning on sleep study, patient has not heard back about scheduling this, advised she call neurology office    Patient Instructions  Plan:  Increase Cymbalta  If doing well, can send refills  If struggling after 6 weeks or so, come see me and we can discuss other plans     Follow-up plan: Return in about 6 months (around 01/26/2018) for [facility], sooner if needed for medications .  Visit summary with medication list and pertinent instructions was printed for patient to review, alert [place] if any changes needed. All questions at time of visit were answered - patient instructed to contact office with any additional concerns. ER/RTC precautions were reviewed with the patient and understanding verbalized.

## 2017-07-28 NOTE — Patient Instructions (Addendum)
Plan:  Increase Cymbalta  If doing well, can send refills  If struggling after 6 weeks or so, come see me and we can discuss other plans

## 2017-08-01 MED FILL — ZOLPIDEM TART ER 12.5 MG TA: 12.5 | 30 days supply | Qty: 30 | Fill #0

## 2017-08-21 ENCOUNTER — Ambulatory Visit (INDEPENDENT_AMBULATORY_CARE_PROVIDER_SITE_OTHER): Payer: 59 | Admitting: Neurology

## 2017-08-21 DIAGNOSIS — R519 Headache, unspecified: Secondary | ICD-10-CM

## 2017-08-21 DIAGNOSIS — G4761 Periodic limb movement disorder: Secondary | ICD-10-CM

## 2017-08-21 DIAGNOSIS — G2581 Restless legs syndrome: Secondary | ICD-10-CM

## 2017-08-21 DIAGNOSIS — G478 Other sleep disorders: Secondary | ICD-10-CM

## 2017-08-21 DIAGNOSIS — R351 Nocturia: Secondary | ICD-10-CM

## 2017-08-21 DIAGNOSIS — R51 Headache: Secondary | ICD-10-CM

## 2017-08-21 DIAGNOSIS — G4720 Circadian rhythm sleep disorder, unspecified type: Secondary | ICD-10-CM

## 2017-08-21 DIAGNOSIS — R0683 Snoring: Secondary | ICD-10-CM

## 2017-08-21 DIAGNOSIS — E669 Obesity, unspecified: Secondary | ICD-10-CM

## 2017-08-22 NOTE — Progress Notes (Signed)
Patient referred by Dr. Sheppard Coil, seen by me on 07/11/17, diagnostic PSG on 08/18/17.   Please call and notify the patient that the recent sleep study did not show any significant obstructive sleep apnea or intrinsic/organic sleep d/o. She did not sleep that well, but once she fell asleep was able to achieve all stages of sleep. For chronic insomnia, she may be best treated with cognitive behavioral therapy (CBT-I). She is encouraged to discuss a referral to a psychologist with her PCP.  Please remind patient to try to maintain good sleep hygiene, which means: Keep a regular sleep and wake schedule and make enough time for sleep (7 1/2 to 8 1/2 hours for the average adult), try not to exercise or have a meal within 2 hours of your bedtime, try to keep your bedroom conducive for sleep, that is, cool and dark, without light distractors such as an illuminated alarm clock, and refrain from watching TV right before sleep or in the middle of the night and do not keep the TV or radio on during the night. If a nightlight is used, have it away from the visual field. Also, try not to use or play on electronic devices at bedtime, such as your cell phone, tablet PC or laptop. If you like to read at bedtime on an electronic device, try to dim the background light as much as possible. Do not eat in the middle of the night. Keep pets away from the bedroom environment. For stress relief, try meditation, deep breathing exercises (there are many books and CDs available), a white noise machine or fan can help to diffuse other noise distractors, such as traffic noise. Do not drink alcohol before bedtime, as it can disturb sleep and cause middle of the night awakenings. Never mix alcohol and sedating medications! Avoid narcotic pain medication close to bedtime, as opioids/narcotics can suppress breathing drive and breathing effort.   Please inform patient that she can follow up with PCP.  Once you have spoken to patient, you can  close this encounter.   Thanks,  Star Age, MD, PhD [facility])

## 2017-08-22 NOTE — Procedures (Signed)
PATIENT'S NAME:  [name] [name], [name] [name] DOB:      April 29, 1976      MR#:    [id number]     DATE OF RECORDING: 08/21/2017 REFERRING M.D.:  Emeterio Reeve, MD Study Performed:   Baseline Polysomnogram HISTORY: 41 year old woman with a history of PVCs, anxiety, asthma, hypertension, impaired fasting glucose and obesity, who reports snoring, sleep maintenance and sleep onset difficulties. The patient endorsed the Epworth Sleepiness Scale at 2 points. The patient's weight 191 pounds with a height of 62 (inches), resulting in a BMI of 35.3 kg/m2. The patient's neck circumference measured 14.5 inches.  CURRENT MEDICATIONS: Hygroton, Colace, Cymbalta, Anusol, Toprol XL, Singulair, Ambien CR   PROCEDURE:  This is a multichannel digital polysomnogram utilizing the Somnostar 11.2 system.  Electrodes and sensors were applied and monitored per AASM Specifications.   EEG, EOG, Chin and Limb EMG, were sampled at 200 Hz.  ECG, Snore and Nasal Pressure, Thermal Airflow, Respiratory Effort, CPAP Flow and Pressure, Oximetry was sampled at 50 Hz. Digital video and audio were recorded.      BASELINE STUDY  Lights Out was at 22:18 and Lights On at 05:01.  Total recording time (TRT) was 403 minutes, with a total sleep time (TST) of  319.5 minutes.   The patient's sleep latency was 50.5 minutes, which is delayed. REM latency was 257 minutes, which is markedly delayed. The sleep efficiency was 79.3 %.     SLEEP ARCHITECTURE: WASO (Wake after sleep onset) was 33 minutes with mild sleep fragmentation noted. There were 12 minutes in Stage N1, 238 minutes Stage N2, 47.5 minutes Stage N3 and 22 minutes in Stage REM.  The percentage of Stage N1 was 3.8%, Stage N2 was 74.5%, which is increased, Stage N3 was 14.9%, which is normal and Stage R (REM sleep) was 6.9%, which is markedly reduced. The arousals were noted as: 47 were spontaneous, 8 were associated with PLMs, 0 were associated with respiratory events.  Audio and video analysis  did not show any abnormal or unusual movements, behaviors, phonations or vocalizations. The patient took no bathroom breaks. No significant snoring was noted. The EKG was in keeping with normal sinus rhythm (NSR).  RESPIRATORY ANALYSIS:  There were a total of 0 respiratory events:  0 obstructive apneas, 0 central apneas and 0 mixed apneas with a total of 0 apneas and an apnea index (AI) of 0 /hour. There were 0 hypopneas with a hypopnea index of 0 /hour. The patient also had 0 respiratory event related arousals (RERAs).      The total APNEA/HYPOPNEA INDEX (AHI) was 0/hour and the total RESPIRATORY DISTURBANCE INDEX was 0 /hour.  0 events occurred in REM sleep and 0 events in NREM. The REM AHI was 0 /hour, versus a non-REM AHI of 0. The patient spent 0 minutes of total sleep time in the supine position and 320 minutes in non-supine.. The supine AHI was 0.0 versus a non-supine AHI of 0.0.  OXYGEN SATURATION & C02:  The Wake baseline 02 saturation was 96%, with the lowest being 88%. Time spent below 89% saturation equaled 1 minutes.  PERIODIC LIMB MOVEMENTS: The patient had a total of 59 Periodic Limb Movements.  The Periodic Limb Movement (PLM) index was 11.1 and the PLM Arousal index was 1.5/hour.  Post-study, the patient indicated that sleep was worse than usual.   IMPRESSION:  1. Dysfunctions associated with sleep stages or arousal from sleep  RECOMMENDATIONS:  1. This study does not demonstrate any significant obstructive or  central sleep disordered breathing. This study does not support an intrinsic sleep disorder as a cause of the patient's symptoms. Other causes, including circadian rhythm disturbances, an underlying mood disorder, medication effect and/or an underlying medical problem cannot be ruled out. 2. This study shows sleep fragmentation and abnormal sleep stage percentages; these are nonspecific findings and per se do not signify an intrinsic sleep disorder or a cause for the  patient's sleep-related symptoms. Causes include (but are not limited to) the first night effect of the sleep study, circadian rhythm disturbances, medication effect or an underlying mood disorder or medical problem.  3. The patient should be cautioned not to drive, work at heights, or operate dangerous or heavy equipment when tired or sleepy. Review and reiteration of good sleep hygiene measures should be pursued with any patient. 4. For chronic insomnia, she may be best treated with cognitive behavioral therapy (CBT-I). The patient will be encouraged to discuss a referral to a psychologist.  5. The patient can follow-up with her referring provider, who will be notified of the test results.  I certify that I have reviewed the entire raw data recording prior to the issuance of this report in accordance with the Standards of Accreditation of the American Academy of Sleep Medicine (AASM)   Star Age, MD, PhD Diplomat, American Board of Psychiatry and Neurology (Neurology and Sleep Medicine)

## 2017-08-23 ENCOUNTER — Telehealth: Payer: Self-pay

## 2017-08-23 NOTE — Telephone Encounter (Signed)
-----   Message from Star Age, MD sent at 08/22/2017  8:15 AM EST ----- Patient referred by Dr. Sheppard Coil, seen by me on 07/11/17, diagnostic PSG on 08/18/17.   Please call and notify the patient that the recent sleep study did not show any significant obstructive sleep apnea or intrinsic/organic sleep d/o. She did not sleep that well, but once she fell asleep was able to achieve all stages of sleep. For chronic insomnia, she may be best treated with cognitive behavioral therapy (CBT-I). She is encouraged to discuss a referral to a psychologist with her PCP.  Please remind patient to try to maintain good sleep hygiene, which means: Keep a regular sleep and wake schedule and make enough time for sleep (7 1/2 to 8 1/2 hours for the average adult), try not to exercise or have a meal within 2 hours of your bedtime, try to keep your bedroom conducive for sleep, that is, cool and dark, without light distractors such as an illuminated alarm clock, and refrain from watching TV right before sleep or in the middle of the night and do not keep the TV or radio on during the night. If a nightlight is used, have it away from the visual field. Also, try not to use or play on electronic devices at bedtime, such as your cell phone, tablet PC or laptop. If you like to read at bedtime on an electronic device, try to dim the background light as much as possible. Do not eat in the middle of the night. Keep pets away from the bedroom environment. For stress relief, try meditation, deep breathing exercises (there are many books and CDs available), a white noise machine or fan can help to diffuse other noise distractors, such as traffic noise. Do not drink alcohol before bedtime, as it can disturb sleep and cause middle of the night awakenings. Never mix alcohol and sedating medications! Avoid narcotic pain medication close to bedtime, as opioids/narcotics can suppress breathing drive and breathing effort.   Please inform patient that  she can follow up with PCP.  Once you have spoken to patient, you can close this encounter.   Thanks,  Star Age, MD, PhD [facility])

## 2017-08-23 NOTE — Telephone Encounter (Signed)
Pt has returned the call to [employer], she is asking for a call back re: sleep study results

## 2017-08-23 NOTE — Telephone Encounter (Signed)
I called pt to discuss her sleep study results, no answer, left a message asking her to call me back. 

## 2017-08-23 NOTE — Telephone Encounter (Signed)
I called pt. I advised pt that Dr. Rexene Alberts reviewed pt's sleep study and found that pt did not show any significant osa or an intrinsic/organic sleep disorder. Pt did not sleep well, but did actually achieve all stages of sleep. Dr. Rexene Alberts recommends that pt discuss CBT-I with her PCP. I reviewed sleep hygiene recommendations with the pt, including trying to keep a regular sleep wake schedule, avoiding electronics in the bedroom, keeping the bedroom cool, dark, and quiet, and avoiding eating or exercising within 2 hours of bedtime as well as eating in the middle of the night. I advised pt to keep pets away from the bedtime. I discussed with pt the importance of stress relief and to try meditation, deep breathing exercises, and/or a white noise machine or fan to diffuse other noise distracters. I advised pt to not drink alcohol before bedtime and to never mix alcohol and sedating medications. Pt was advised to avoid narcotic pain medication close to bedtime. I advised pt that a copy of these sleep study results will be sent to Dr. Sheppard Coil. Pt verbalized understanding of results. Pt had no questions at this time but was encouraged to call back if questions arise.

## 2017-08-24 ENCOUNTER — Other Ambulatory Visit: Payer: Self-pay | Admitting: Osteopathic Medicine

## 2017-08-24 DIAGNOSIS — I10 Essential (primary) hypertension: Secondary | ICD-10-CM

## 2017-08-24 MED FILL — METOPROLOL SUCC ER 100 MG T: 100 | 90 days supply | Qty: 90 | Fill #0

## 2017-08-25 MED FILL — CHLORTHALIDONE 25 MG TABS: 25 | 90 days supply | Qty: 90 | Fill #0

## 2017-08-29 MED FILL — ZOLPIDEM TART ER 12.5 MG TA: 12.5 | 30 days supply | Qty: 30 | Fill #1

## 2017-09-15 ENCOUNTER — Encounter: Payer: Self-pay | Admitting: Osteopathic Medicine

## 2017-09-26 MED FILL — ZOLPIDEM TART ER 12.5 MG TA: 12.5 | 30 days supply | Qty: 30 | Fill #2

## 2017-10-24 MED FILL — ZOLPIDEM TART ER 12.5 MG TA: 12.5 | 30 days supply | Qty: 30 | Fill #3

## 2017-11-07 DIAGNOSIS — H52223 Regular astigmatism, bilateral: Secondary | ICD-10-CM | POA: Diagnosis not present

## 2017-11-07 DIAGNOSIS — H524 Presbyopia: Secondary | ICD-10-CM | POA: Diagnosis not present

## 2017-11-17 DIAGNOSIS — B373 Candidiasis of vulva and vagina: Secondary | ICD-10-CM | POA: Diagnosis not present

## 2017-11-17 DIAGNOSIS — N3946 Mixed incontinence: Secondary | ICD-10-CM | POA: Diagnosis not present

## 2017-11-17 DIAGNOSIS — Z01419 Encounter for gynecological examination (general) (routine) without abnormal findings: Secondary | ICD-10-CM | POA: Diagnosis not present

## 2017-11-17 DIAGNOSIS — Z90710 Acquired absence of both cervix and uterus: Secondary | ICD-10-CM | POA: Diagnosis not present

## 2017-11-17 MED FILL — FLUCONAZOLE 150 MG TABLET: 150 | 90 days supply | Qty: 6 | Fill #0 | Status: TO

## 2017-11-18 MED FILL — METOPROLOL SUCCINATE ER 100: 100 | 90 days supply | Qty: 90 | Fill #1

## 2017-11-18 MED FILL — CHLORTHALIDONE 25 MG TAB: 25 | 90 days supply | Qty: 90 | Fill #1

## 2017-11-23 ENCOUNTER — Other Ambulatory Visit: Payer: Self-pay | Admitting: Osteopathic Medicine

## 2017-11-23 MED FILL — DULoxetine HCL 60 MG CPEP: 60 | 90 days supply | Qty: 90 | Fill #1

## 2017-11-23 MED FILL — ZOLPIDEM TART ER 12.5 MG TA: 12.5 | 30 days supply | Qty: 30 | Fill #0

## 2017-11-23 NOTE — Telephone Encounter (Signed)
Left brief vm msg for pt regarding update on medication refill request.

## 2017-11-23 NOTE — Telephone Encounter (Signed)
Pharmacy requesting refill for ambien medication. Thanks.

## 2017-12-13 ENCOUNTER — Encounter: Payer: Self-pay | Admitting: Osteopathic Medicine

## 2017-12-15 ENCOUNTER — Other Ambulatory Visit: Payer: Self-pay

## 2017-12-15 ENCOUNTER — Emergency Department
Admission: EM | Admit: 2017-12-15 | Discharge: 2017-12-15 | Disposition: A | Discharge status: Home / Self Care | Payer: 59 | Source: Home / Self Care | Attending: Emergency Medicine | Admitting: Emergency Medicine

## 2017-12-15 ENCOUNTER — Encounter: Payer: Self-pay | Admitting: *Deleted

## 2017-12-15 DIAGNOSIS — J0100 Acute maxillary sinusitis, unspecified: Secondary | ICD-10-CM | POA: Diagnosis not present

## 2017-12-15 LAB — POCT RAPID STREP A (OFFICE): RAPID STREP A SCREEN: NEGATIVE

## 2017-12-15 MED ORDER — AMOXICILLIN 875 MG PO TABS: 875.0000 mg | ORAL_TABLET | Freq: Two times a day (BID) | ORAL | 0 refills | Status: DC

## 2017-12-15 MED ORDER — FLUTICASONE PROPIONATE 50 MCG/ACT NA SUSP: 2.0000 | Freq: Every day | NASAL | 2 refills | Status: AC

## 2017-12-15 MED FILL — AMOXICILLIN 875 MG TABLET: 875 | 10 days supply | Qty: 20 | Fill #0

## 2017-12-15 MED FILL — FLUTICASONE PROP 50 MCG SPR: 50 | 30 days supply | Qty: 16 | Fill #0

## 2017-12-15 NOTE — ED Provider Notes (Signed)
[facility]    CSN: [id number] Arrival date & time: 12/15/17  1310     History   Chief Complaint Chief Complaint  Patient presents with  . Cough    HPI [name] [name] is a 42 y.o. female.  Patient states she has been ill for 1 week now. Her symptoms have included a sore throat, post nasal drip, and a dry cough. She has had some fever off and on and myalgias. She does state she took a flu shot in the fall of last year. She has 1 child at home who has also been ill. Recently she has developed facial pain along with a yellow nasal drainage. She has had no GI symptoms. She has tried DayQuil, TheraFlu, and Tylenol Cold and sinus. She is a nonsmoker with a history of hypertension. She has had a hysterectomy. HPI  Past Medical History:  Diagnosis Date  . Anxiety   . Asthma, chronic 01/02/2014  . Essential hypertension, benign 01/02/2014  . Insomnia   . Obesity 01/02/2014    Patient Active Problem List   Diagnosis Date Noted  . PVC (premature ventricular contraction) 12/27/2016  . Weight gain 07/19/2016  . Postoperative state 06/30/2015  . Impaired fasting glucose 03/03/2015  . Generalized anxiety disorder 05/02/2014  . Leukocytosis 01/21/2014  . Asthma, chronic 01/02/2014  . Insomnia 01/02/2014  . Essential hypertension, benign 01/02/2014  . Obesity 01/02/2014    Past Surgical History:  Procedure Laterality Date  . BREAST REDUCTION SURGERY  2001  . CHOLECYSTECTOMY    . gallbladder removed  2001  . lipo suction  2012  . TUBAL LIGATION  2010    OB History    Gravida Para Term Preterm AB Living   3 3 3     3    SAB TAB Ectopic Multiple Live Births                   Home Medications    Prior to Admission medications   Medication Sig Start Date End Date Taking? Authorizing Provider  chlorthalidone (HYGROTON) 25 MG tablet TAKE 1 TABLET (25 MG TOTAL) BY MOUTH DAILY. 08/25/17  Yes Emeterio Reeve, DO  metoprolol succinate (TOPROL-XL) 100 MG 24 hr  tablet Take 1 tablet (100 mg total) by mouth daily. Take with or immediately following a meal. 12/27/16  Yes Emeterio Reeve, DO  zolpidem (AMBIEN CR) 12.5 MG CR tablet TAKE 1 TABLET BY MOUTH AT BEDTIME AS NEEDED 11/23/17  Yes Emeterio Reeve, DO  amoxicillin (AMOXIL) 875 MG tablet Take 1 tablet (875 mg total) by mouth 2 (two) times daily. 12/15/17   Darlyne Russian, MD  docusate sodium (COLACE) 100 MG capsule Take 1 capsule (100 mg total) by mouth 2 (two) times daily. As needed to soften stool 06/08/17   Emeterio Reeve, DO  DULoxetine (CYMBALTA) 60 MG capsule Take 1 capsule (60 mg total) by mouth daily. 07/28/17   Emeterio Reeve, DO  fluticasone (FLONASE) 50 MCG/ACT nasal spray Place 2 sprays into both nostrils daily. 12/15/17   Darlyne Russian, MD  hydrocortisone (ANUSOL-HC) 25 MG suppository Place 1 suppository (25 mg total) rectally 2 (two) times daily as needed for hemorrhoids. 06/08/17   Emeterio Reeve, DO  montelukast (SINGULAIR) 10 MG tablet Take 1 tablet (10 mg total) by mouth at bedtime. 06/08/17   Emeterio Reeve, DO    Family History Family History  Problem Relation Age of Onset  . Hypertension Mother   . Hypertension Maternal Grandmother   .  Other Sister        Deceaesd, 16 from blood clot  . Bipolar disorder Sister   . Healthy Son        x2  . Healthy Daughter        x1    Social History Social History   Tobacco Use  . Smoking status: Never Smoker  . Smokeless tobacco: Never Used  Substance Use Topics  . Alcohol use: No  . Drug use: No     Allergies   Patient has no known allergies.   Review of Systems Review of Systems  Constitutional: Positive for fatigue and fever.  HENT: Positive for congestion, postnasal drip, sinus pain and sore throat.   Eyes: Negative.   Respiratory: Positive for cough. Negative for shortness of breath, wheezing and stridor.   Cardiovascular: Negative for chest pain.  Gastrointestinal: Negative.   Musculoskeletal:  Positive for myalgias.     Physical Exam Triage Vital Signs ED Triage Vitals  Enc Vitals Group     BP 12/15/17 1323 (!) 143/92     Pulse Rate 12/15/17 1323 79     Resp 12/15/17 1323 18     Temp 12/15/17 1323 98.4 F (36.9 C)     Temp Source 12/15/17 1323 Oral     SpO2 12/15/17 1323 98 %     Weight 12/15/17 1325 190 lb (86.2 kg)     Height 12/15/17 1325 5\' 2"  (1.575 m)     Head Circumference --      [place] Flow --      Pain Score 12/15/17 1325 0     Pain Loc --      Pain Edu? --      Excl. in [place]? --    No data found.  Updated Vital Signs BP (!) 143/92 (BP Location: Right Arm)   Pulse 79   Temp 98.4 F (36.9 C) (Oral)   Resp 18   Ht 5\' 2"  (1.575 m)   Wt 190 lb (86.2 kg)   SpO2 98%   BMI 34.75 kg/m   Visual Acuity Right Eye Distance:   Left Eye Distance:   Bilateral Distance:    Right Eye Near:   Left Eye Near:    Bilateral Near:     Physical Exam  Constitutional: She appears well-developed and well-nourished.  HENT:  Head: Normocephalic and atraumatic.  Mouth/Throat: Oropharynx is clear and moist.  Ear canals occluded with wax. There is significant nasal congestion. There is redness of the posterior pharynx but no exudate seen over the tonsils.  Neck: Normal range of motion. Neck supple.  Cardiovascular: Normal rate.  No murmur heard. Pulmonary/Chest: Effort normal and breath sounds normal. No respiratory distress. She has no rales.  Abdominal: Soft.     UC Treatments / Results  Labs (all labs ordered are listed, but only abnormal results are displayed) Labs Reviewed  POCT RAPID STREP A (OFFICE)    EKG  EKG Interpretation None       Radiology No results found.  Procedures Procedures (including critical care time)  Medications Ordered in UC Medications - No data to display   Initial Impression / Assessment and Plan / UC Course  I have reviewed the triage vital signs and the nursing notes.  Pertinent labs & imaging results that were  available during my care of the patient were reviewed by me and considered in my medical decision making (see chart for details). Patient presents with signs and symptoms consistent with an acute sinusitis.  Strep test was negative. Will treat with amoxicillin and Flonase and sinus care.       Final Clinical Impressions(s) / UC Diagnoses   Final diagnoses:  Acute maxillary sinusitis, recurrence not specified    ED Discharge Orders        Ordered    amoxicillin (AMOXIL) 875 MG tablet  2 times daily     12/15/17 1421    fluticasone (FLONASE) 50 MCG/ACT nasal spray  Daily     12/15/17 1421       Controlled Substance Prescriptions [place] Controlled Substance Registry consulted? Not Applicable   Darlyne Russian, MD 12/15/17 [phone]

## 2017-12-15 NOTE — Discharge Instructions (Signed)
Use saline spray frequently during the day. Take antibiotics twice a day. Use nasal spray as instructed.

## 2017-12-15 NOTE — ED Triage Notes (Signed)
Pt c/o nonproductive cough, sore throat and runny nose x 1 wk. Denies fever. She has taken Theraflu, Tylenol severe cold, and Dayquil.

## 2017-12-20 ENCOUNTER — Encounter: Payer: Self-pay | Admitting: Osteopathic Medicine

## 2017-12-20 ENCOUNTER — Ambulatory Visit (INDEPENDENT_AMBULATORY_CARE_PROVIDER_SITE_OTHER): Payer: 59 | Admitting: Osteopathic Medicine

## 2017-12-20 VITALS — BP 131/76 | HR 85 | Temp 98.2°F | Wt 192.0 lb

## 2017-12-20 DIAGNOSIS — H1032 Unspecified acute conjunctivitis, left eye: Secondary | ICD-10-CM | POA: Diagnosis not present

## 2017-12-20 DIAGNOSIS — R059 Cough, unspecified: Secondary | ICD-10-CM

## 2017-12-20 DIAGNOSIS — J019 Acute sinusitis, unspecified: Secondary | ICD-10-CM

## 2017-12-20 DIAGNOSIS — B9689 Other specified bacterial agents as the cause of diseases classified elsewhere: Secondary | ICD-10-CM | POA: Diagnosis not present

## 2017-12-20 DIAGNOSIS — R05 Cough: Secondary | ICD-10-CM

## 2017-12-20 MED ORDER — IPRATROPIUM BROMIDE 0.06 % NA SOLN: 2.0000 | Freq: Four times a day (QID) | NASAL | 1 refills | Status: AC

## 2017-12-20 MED ORDER — ERYTHROMYCIN 5 MG/GM OP OINT: 1.0000 "application " | TOPICAL_OINTMENT | Freq: Three times a day (TID) | OPHTHALMIC | 0 refills | Status: AC

## 2017-12-20 MED ORDER — BENZONATATE 200 MG PO CAPS: 200.0000 mg | ORAL_CAPSULE | Freq: Three times a day (TID) | ORAL | 0 refills | Status: AC | PRN

## 2017-12-20 MED ORDER — DOXYCYCLINE HYCLATE 100 MG PO TABS: 100.0000 mg | ORAL_TABLET | Freq: Two times a day (BID) | ORAL | 0 refills | Status: AC

## 2017-12-20 MED FILL — IPRATROPIUM 0.06% SPRAY: 0.06 | 25 days supply | Qty: 15 | Fill #0

## 2017-12-20 MED FILL — DOXYCYCLINE HYCLATE 100 MG: 100 | 7 days supply | Qty: 14 | Fill #0

## 2017-12-20 MED FILL — ERYTHROMYCIN EYE OINTMENT: 5 | 20 days supply | Qty: 4 | Fill #0

## 2017-12-20 MED FILL — BENZONATATE 200 MG CAP: 200 | 10 days supply | Qty: 30 | Fill #0

## 2017-12-20 NOTE — Progress Notes (Signed)
HPI: [name] [name] is a 42 y.o. female who  has a past medical history of Anxiety, Asthma, chronic (01/02/2014), Essential hypertension, benign (01/02/2014), Insomnia, and Obesity (01/02/2014).  she presents to [facility] today, 12/20/17,  for chief complaint of: Sick - eye issue, cold symptoms   Eye gunk and itching x1 day, started yesterday at work.   Cold seems to be lingering, still taking abx form urgent care seen there 12/15/17 (five days ago - symptoms at that time for 1 week, sore throat, postnasal drip, dry cough, fever/myalgia, facial pain. Was prescribed amoxicillin and flonase). Now: coughing, sore throat, nasal congestion, feels like mucus buildup but doesn't seem to be coming up until she takes the medications. Was only taking the amoxicillin daily for the first few days, then read the bottle and noted her mistake and now on bid dose.      Past medical, surgical, social and family history reviewed:  Patient Active Problem List   Diagnosis Date Noted  . PVC (premature ventricular contraction) 12/27/2016  . Weight gain 07/19/2016  . Postoperative state 06/30/2015  . Impaired fasting glucose 03/03/2015  . Generalized anxiety disorder 05/02/2014  . Leukocytosis 01/21/2014  . Asthma, chronic 01/02/2014  . Insomnia 01/02/2014  . Essential hypertension, benign 01/02/2014  . Obesity 01/02/2014    Past Surgical History:  Procedure Laterality Date  . BREAST REDUCTION SURGERY  2001  . CHOLECYSTECTOMY    . gallbladder removed  2001  . lipo suction  2012  . TUBAL LIGATION  2010    Social History   Tobacco Use  . Smoking status: Never Smoker  . Smokeless tobacco: Never Used  Substance Use Topics  . Alcohol use: No    Family History  Problem Relation Age of Onset  . Hypertension Mother   . Hypertension Maternal Grandmother   . Other Sister        Deceaesd, 32 from blood clot  . Bipolar disorder Sister   . Healthy Son     x2  . Healthy Daughter        x1     Current medication list and allergy/intolerance information reviewed:    Current Outpatient Medications  Medication Sig Dispense Refill  . amoxicillin (AMOXIL) 875 MG tablet Take 1 tablet (875 mg total) by mouth 2 (two) times daily. 20 tablet 0  . chlorthalidone (HYGROTON) 25 MG tablet TAKE 1 TABLET (25 MG TOTAL) BY MOUTH DAILY. 90 tablet 1  . docusate sodium (COLACE) 100 MG capsule Take 1 capsule (100 mg total) by mouth 2 (two) times daily. As needed to soften stool 15 capsule 3  . DULoxetine (CYMBALTA) 60 MG capsule Take 1 capsule (60 mg total) by mouth daily. 90 capsule 1  . fluticasone (FLONASE) 50 MCG/ACT nasal spray Place 2 sprays into both nostrils daily. 16 g 2  . hydrocortisone (ANUSOL-HC) 25 MG suppository Place 1 suppository (25 mg total) rectally 2 (two) times daily as needed for hemorrhoids. 24 suppository 2  . metoprolol succinate (TOPROL-XL) 100 MG 24 hr tablet Take 1 tablet (100 mg total) by mouth daily. Take with or immediately following a meal. 90 tablet 3  . montelukast (SINGULAIR) 10 MG tablet Take 1 tablet (10 mg total) by mouth at bedtime. 90 tablet 3  . zolpidem (AMBIEN CR) 12.5 MG CR tablet TAKE 1 TABLET BY MOUTH AT BEDTIME AS NEEDED 30 tablet 3   No current facility-administered medications for this visit.     No Known Allergies  Review of Systems:  Constitutional:  No  fever, no chills, +recent illness, No unintentional weight changes. No significant fatigue.   HEENT: No  headache, no vision change, no hearing change, +sore throat, +sinus pressure  Cardiac: No  chest pain, No  pressure, No palpitations  Respiratory:  No  shortness of breath. +Cough  Gastrointestinal: No  abdominal pain, No  nausea, No  vomiting,  No  blood in stool, No  diarrhea  Musculoskeletal: No new myalgia/arthralgia  Skin: No  Rash  Neurologic: No  weakness, No  dizziness  Exam:  BP 131/76   Pulse 85   Temp 98.2 F (36.8 C) (Oral)    Wt 192 lb 0.6 oz (87.1 kg)   BMI 35.12 kg/m    Constitutional: VS see above. General Appearance: alert, well-developed, well-nourished, NAD  Eyes: Normal lids, non-icteric sclera, injected conjunctiva on the L w/o drainage, EOMI nonpainful  Ears, Nose, Mouth, Throat: MMM, Normal external inspection ears/nares/mouth/lips/gums. TM normal bilaterally. Pharynx/tonsils no erythema, no exudate. Nasal mucosa normal.   Neck: No masses, trachea midline. No tenderness/mass appreciated. No lymphadenopathy  Respiratory: Normal respiratory effort. no wheeze, no rhonchi, no rales  Cardiovascular: S1/S2 normal, no murmur, no rub/gallop auscultated. RRR. No lower extremity edema.   Gastrointestinal: Nontender, no masses. Bowel sounds normal.  Musculoskeletal: Gait normal.   Neurological: Normal balance/coordination. No tremor.   Skin: warm, dry, intact.   Psychiatric: Normal judgment/insight. Normal mood and affect.     ASSESSMENT/PLAN: The primary encounter diagnosis was Acute bacterial sinusitis. Diagnoses of Acute conjunctivitis of left eye, unspecified acute conjunctivitis type and Cough were also pertinent to this visit.   Patient Instructions  Acute bacterial sinusitis - Plan: doxycycline (VIBRA-TABS) 100 MG tablet twice daily, ipratropium (ATROVENT) 0.06 % nasal spray up to 4 times per day. STOP the amoxicillin and flonase.   Acute conjunctivitis of left eye - Plan: erythromycin ophthalmic ointment 3 times per day for 3-5 days   Cough - Plan: benzonatate (TESSALON) 200 MG capsule 3 times per day as needed for cough       Visit summary with medication list and pertinent instructions was printed for patient to review. All questions at time of visit were answered - patient instructed to contact office with any additional concerns. ER/RTC precautions were reviewed with the patient.   Follow-up plan: Return if symptoms worsen or fail to improve.    Please note: voice recognition  software was used to produce this document, and typos may escape review. Please contact Dr. Sheppard Coil for any needed clarifications.

## 2017-12-20 NOTE — Patient Instructions (Addendum)
Acute bacterial sinusitis - Plan: doxycycline (VIBRA-TABS) 100 MG tablet twice daily, ipratropium (ATROVENT) 0.06 % nasal spray up to 4 times per day. STOP the amoxicillin and flonase.   Acute conjunctivitis of left eye - Plan: erythromycin ophthalmic ointment 3 times per day for 3-5 days   Cough - Plan: benzonatate (TESSALON) 200 MG capsule 3 times per day as needed for cough

## 2017-12-23 MED FILL — ZOLPIDEM TART ER 12.5 MG TA: 12.5 | 30 days supply | Qty: 30 | Fill #1

## 2018-01-20 MED FILL — ZOLPIDEM TART ER 12.5 MG TA: 12.5 | 30 days supply | Qty: 30 | Fill #2

## 2018-02-06 ENCOUNTER — Other Ambulatory Visit: Payer: Self-pay | Admitting: Osteopathic Medicine

## 2018-02-06 DIAGNOSIS — I10 Essential (primary) hypertension: Secondary | ICD-10-CM

## 2018-02-06 MED FILL — CHLORTHALIDONE 25 MG TAB: 25 | 90 days supply | Qty: 90 | Fill #0

## 2018-02-06 MED FILL — METOPROLOL SUCCINATE ER 100: 100 | 90 days supply | Qty: 90 | Fill #0

## 2018-02-20 MED FILL — ZOLPIDEM TART ER 12.5 MG TA: 12.5 | 30 days supply | Qty: 30 | Fill #3

## 2018-03-07 ENCOUNTER — Other Ambulatory Visit: Payer: Self-pay | Admitting: Osteopathic Medicine

## 2018-03-07 DIAGNOSIS — F411 Generalized anxiety disorder: Secondary | ICD-10-CM

## 2018-03-07 MED FILL — DULoxetine HCL 60 MG CPEP: 60 | 90 days supply | Qty: 90 | Fill #0

## 2018-03-17 ENCOUNTER — Other Ambulatory Visit: Payer: Self-pay | Admitting: Osteopathic Medicine

## 2018-03-17 NOTE — Telephone Encounter (Signed)
[place] requesting med RF for zolpidem.

## 2018-03-17 NOTE — Telephone Encounter (Signed)
Pt has been updated.  

## 2018-03-22 MED FILL — ZOLPIDEM TART ER 12.5 MG TA: 12.5 | 30 days supply | Qty: 30 | Fill #0 | Status: TO
